# Patient Record
Sex: Male | Born: 1935 | Race: White | Hispanic: No | Marital: Married | Smoking: Former smoker
Health system: Southern US, Community
[De-identification: ages and names within clinical notes are randomized; demographics above are authoritative.]

## PROBLEM LIST (undated history)

## (undated) DIAGNOSIS — R0902 Hypoxemia: Secondary | ICD-10-CM

## (undated) DIAGNOSIS — F319 Bipolar disorder, unspecified: Secondary | ICD-10-CM

## (undated) DIAGNOSIS — E785 Hyperlipidemia, unspecified: Secondary | ICD-10-CM

## (undated) DIAGNOSIS — G4730 Sleep apnea, unspecified: Secondary | ICD-10-CM

## (undated) DIAGNOSIS — F32A Depression, unspecified: Secondary | ICD-10-CM

## (undated) DIAGNOSIS — J189 Pneumonia, unspecified organism: Secondary | ICD-10-CM

## (undated) DIAGNOSIS — I714 Abdominal aortic aneurysm, without rupture: Secondary | ICD-10-CM

## (undated) DIAGNOSIS — N400 Enlarged prostate without lower urinary tract symptoms: Secondary | ICD-10-CM

## (undated) DIAGNOSIS — I10 Essential (primary) hypertension: Secondary | ICD-10-CM

## (undated) DIAGNOSIS — F329 Major depressive disorder, single episode, unspecified: Secondary | ICD-10-CM

## (undated) HISTORY — DX: Benign prostatic hyperplasia without lower urinary tract symptoms: N40.0

## (undated) HISTORY — DX: Pneumonia, unspecified organism: J18.9

## (undated) HISTORY — DX: Hyperlipidemia, unspecified: E78.5

## (undated) HISTORY — DX: Sleep apnea, unspecified: G47.30

## (undated) HISTORY — DX: Abdominal aortic aneurysm, without rupture: I71.4

## (undated) HISTORY — DX: Hypoxemia: R09.02

---

## 1998-07-19 ENCOUNTER — Emergency Department (HOSPITAL_COMMUNITY): Admission: EM | Admit: 1998-07-19 | Discharge: 1998-07-19 | Payer: Self-pay | Admitting: Emergency Medicine

## 2000-11-30 ENCOUNTER — Ambulatory Visit (HOSPITAL_BASED_OUTPATIENT_CLINIC_OR_DEPARTMENT_OTHER): Admission: RE | Admit: 2000-11-30 | Discharge: 2000-11-30 | Payer: Self-pay | Admitting: Otolaryngology

## 2008-04-02 ENCOUNTER — Other Ambulatory Visit: Payer: Self-pay | Admitting: Emergency Medicine

## 2008-04-03 ENCOUNTER — Inpatient Hospital Stay (HOSPITAL_COMMUNITY): Admission: EM | Admit: 2008-04-03 | Discharge: 2008-04-16 | Payer: Self-pay | Admitting: Psychiatry

## 2008-04-03 ENCOUNTER — Ambulatory Visit: Payer: Self-pay | Admitting: Psychiatry

## 2008-04-04 ENCOUNTER — Encounter: Payer: Self-pay | Admitting: Psychiatry

## 2008-04-23 ENCOUNTER — Emergency Department (HOSPITAL_COMMUNITY): Admission: EM | Admit: 2008-04-23 | Discharge: 2008-04-24 | Payer: Self-pay | Admitting: Emergency Medicine

## 2009-12-18 ENCOUNTER — Encounter: Admission: RE | Admit: 2009-12-18 | Discharge: 2009-12-18 | Payer: Self-pay | Admitting: Otolaryngology

## 2010-02-02 ENCOUNTER — Encounter
Admission: RE | Admit: 2010-02-02 | Discharge: 2010-02-02 | Payer: Self-pay | Source: Home / Self Care | Attending: Family Medicine | Admitting: Family Medicine

## 2010-07-19 LAB — COMPREHENSIVE METABOLIC PANEL
ALT: 160 U/L — ABNORMAL HIGH (ref 0–53)
AST: 112 U/L — ABNORMAL HIGH (ref 0–37)
Albumin: 3.6 g/dL (ref 3.5–5.2)
Alkaline Phosphatase: 64 U/L (ref 39–117)
Alkaline Phosphatase: 104 U/L (ref 39–117)
BUN: 23 mg/dL (ref 6–23)
CO2: 26 mEq/L (ref 19–32)
Calcium: 9.4 mg/dL (ref 8.4–10.5)
Calcium: 8.5 mg/dL (ref 8.4–10.5)
Creatinine, Ser: 1.23 mg/dL (ref 0.4–1.5)
GFR calc Af Amer: 52 mL/min — ABNORMAL LOW (ref >60)
GFR calc non Af Amer: 43 mL/min — ABNORMAL LOW (ref >60)
Glucose, Bld: 112 mg/dL — ABNORMAL HIGH (ref 70–99)
Potassium: 3.7 mEq/L (ref 3.5–5.1)
Potassium: 3.9 mEq/L (ref 3.5–5.1)
Sodium: 133 mEq/L — ABNORMAL LOW (ref 135–145)
Total Protein: 6.2 g/dL (ref 6.0–8.3)
Total Protein: 6.4 g/dL (ref 6.0–8.3)

## 2010-07-19 LAB — T4, FREE: Free T4: 1.3 ng/dL (ref 0.89–1.80)

## 2010-07-19 LAB — LITHIUM LEVEL: Lithium Lvl: <0.25 mEq/L — ABNORMAL LOW (ref 0.80–1.40)

## 2010-07-19 LAB — DIFFERENTIAL
Eosinophils Absolute: 0.2 10*3/uL (ref 0.0–0.7)
Eosinophils Relative: 2 % (ref 0–5)
Lymphs Abs: 0.6 10*3/uL — ABNORMAL LOW (ref 0.7–4.0)
Monocytes Relative: 11 % (ref 3–12)

## 2010-07-19 LAB — URINE MICROSCOPIC-ADD ON

## 2010-07-19 LAB — URINALYSIS, ROUTINE W REFLEX MICROSCOPIC
Glucose, UA: NEGATIVE mg/dL
Ketones, ur: NEGATIVE mg/dL
Protein, ur: NEGATIVE mg/dL

## 2010-07-19 LAB — CBC
MCHC: 34.3 g/dL (ref 30.0–36.0)
RBC: 4.42 MIL/uL (ref 4.22–5.81)

## 2010-07-19 LAB — VITAMIN B12: Vitamin B-12: 461 pg/mL (ref 211–911)

## 2010-07-19 LAB — TSH: TSH: 1.622 u[IU]/mL (ref 0.350–4.500)

## 2010-07-19 LAB — ACETAMINOPHEN LEVEL: Acetaminophen (Tylenol), Serum: <10 ug/mL — ABNORMAL LOW (ref 10–30)

## 2010-07-19 LAB — RPR: RPR Ser Ql: NONREACTIVE

## 2010-08-17 NOTE — Discharge Summary (Signed)
NAME:  Jeffrey Nguyen, Jeffrey Nguyen                ACCOUNT NO.:  [id number]   MEDICAL RECORD NO.:  [id number]          PATIENT TYPE:  IPS   LOCATION:  0302                          FACILITY:  [facility]   PHYSICIAN:  Anselm Jungling, MD  DATE OF BIRTH:  10-05-1935   DATE OF ADMISSION:  04/03/2008  DATE OF DISCHARGE:  04/16/2008                               DISCHARGE SUMMARY   IDENTIFYING DATA AND REASON FOR ADMISSION:  The patient is a 75 year old  married male with a history of chronic schizophrenia and alcohol abuse.  He was admitted due to decompensation of his primary psychiatric  disorder as well as alcohol abuse.  Please refer to the admission note  for further details pertaining to the symptoms, circumstances and  history that led to his hospitalization.  He was given initial Axis I  diagnosis of psychosis NOS, and substance abuse NOS.   LABORATORY:  The patient was medically and physically assessed in the  emergency department, and reviewed again by the psychiatric nurse  practitioner upon admission to the adult inpatient service.  He came to  [place] with history of hypertension and was continued on lisinopril 10 mg  daily.  At the time of discharge, he was directed to return to his  personal medical physician for general followup and medical care.   HOSPITAL COURSE:  The patient was admitted to the adult inpatient  psychiatric service.  He presented as a disheveled, ill appearing many  who was adequately nourished.  He appeared disoriented, confused, and  difficulty explaining why he was hospitalized.  He stated that he was  in a bad place.   The patient remained in bed for most of the first 3 days of his hospital  stay.  On the third hospital day, the only verbal response he made to  the undersigned was a loud hooting noise.  He then covered his eyes and  moaned.   He was started on a regimen of low-dose Haldol 5 mg t.i.d., which was  later changed 10 mg nightly.  With this, he cleared  gradually over the  next several days, was able to participate more in therapeutic groups  and activities, and was able to be transferred to the open unit.   On the eighth hospital day there was a family session involving the  patient and his wife.  His difficulties prior to admission were  discussed at length.  In this meeting, the patient revealed some  delusional ideation that he had not verbalized previously.  This was  indication to the treatment team that the patient needed more time for  stabilization.  His hospital stay continued an additional 7 days, and he  continued to show gradual improvement in his thinking, cognition, and  participation.   On the 14th hospital day, the patient appeared appropriate for discharge  home with his wife.   AFTERCARE:  The patient was to follow up with Dr. Betti Cruz at [facility] with an appointment on June 02, 2008 at 11 a.m.  He was put  on an early cancellation list in case he could  be seen by Dr. Betti Cruz  sooner.   DISCHARGE MEDICATIONS:  1. Haldol 10 mg nightly.  2. Lisinopril 10 mg daily.   The patient was instructed to follow up with his usual medical Jeffrey Nguyen  regarding his general medical needs.   DISCHARGE DIAGNOSES:  AXIS I:  Schizophrenia, chronic undifferentiated  type, acute exacerbation, resolving, and alcohol abuse not otherwise  specified.  AXIS II:  Deferred.  AXIS III:  History of hypertension.  AXIS IV:  Stressors severe.  AXIS V:  GAF on discharge 55.      Anselm Jungling, MD  Electronically Signed     SPB/MEDQ  D:  04/17/2008  T:  04/17/2008  Job:  [id number]

## 2011-01-07 LAB — RAPID URINE DRUG SCREEN, HOSP PERFORMED
Barbiturates: NOT DETECTED
Benzodiazepines: NOT DETECTED
Cocaine: NOT DETECTED
Opiates: NOT DETECTED

## 2011-01-07 LAB — CBC
Hemoglobin: 16.3 g/dL (ref 13.0–17.0)
RBC: 5.32 MIL/uL (ref 4.22–5.81)
RDW: 13.4 % (ref 11.5–15.5)
WBC: 8.9 10*3/uL (ref 4.0–10.5)

## 2011-01-07 LAB — URINALYSIS, ROUTINE W REFLEX MICROSCOPIC
Glucose, UA: NEGATIVE mg/dL
Ketones, ur: 40 mg/dL — AB
Leukocytes, UA: NEGATIVE
Protein, ur: 30 mg/dL — AB

## 2011-01-07 LAB — COMPREHENSIVE METABOLIC PANEL
ALT: 87 U/L — ABNORMAL HIGH (ref 0–53)
Alkaline Phosphatase: 74 U/L (ref 39–117)
CO2: 21 mEq/L (ref 19–32)
Glucose, Bld: 122 mg/dL — ABNORMAL HIGH (ref 70–99)
Potassium: 3.8 mEq/L (ref 3.5–5.1)
Sodium: 133 mEq/L — ABNORMAL LOW (ref 135–145)
Total Protein: 7.3 g/dL (ref 6.0–8.3)

## 2011-01-07 LAB — DIFFERENTIAL
Basophils Relative: 0 % (ref 0–1)
Eosinophils Absolute: 0 10*3/uL (ref 0.0–0.7)
Monocytes Relative: 10 % (ref 3–12)
Neutrophils Relative %: 74 % (ref 43–77)

## 2011-01-07 LAB — ETHANOL: Alcohol, Ethyl (B): <5 mg/dL (ref 0–10)

## 2011-01-07 LAB — URINE MICROSCOPIC-ADD ON

## 2011-03-24 ENCOUNTER — Emergency Department (HOSPITAL_COMMUNITY)
Admission: EM | Admit: 2011-03-24 | Discharge: 2011-03-24 | Disposition: A | Discharge status: Other Acute Inpatient Hospital | Payer: Medicare Other | Attending: Emergency Medicine | Admitting: Emergency Medicine

## 2011-03-24 DIAGNOSIS — F319 Bipolar disorder, unspecified: Secondary | ICD-10-CM | POA: Insufficient documentation

## 2011-03-24 DIAGNOSIS — I10 Essential (primary) hypertension: Secondary | ICD-10-CM | POA: Insufficient documentation

## 2011-03-24 DIAGNOSIS — R45851 Suicidal ideations: Secondary | ICD-10-CM | POA: Insufficient documentation

## 2011-03-24 HISTORY — DX: Depression, unspecified: F32.A

## 2011-03-24 HISTORY — DX: Essential (primary) hypertension: I10

## 2011-03-24 HISTORY — DX: Major depressive disorder, single episode, unspecified: F32.9

## 2011-03-24 HISTORY — DX: Bipolar disorder, unspecified: F31.9

## 2011-03-24 LAB — BASIC METABOLIC PANEL
BUN: 17 mg/dL (ref 6–23)
Calcium: 8.9 mg/dL (ref 8.4–10.5)
Chloride: 103 mEq/L (ref 96–112)
Creatinine, Ser: 1.27 mg/dL (ref 0.50–1.35)
GFR calc Af Amer: 63 mL/min — ABNORMAL LOW (ref >90)

## 2011-03-24 LAB — RAPID URINE DRUG SCREEN, HOSP PERFORMED
Barbiturates: NOT DETECTED
Benzodiazepines: NOT DETECTED

## 2011-03-24 LAB — CBC
HCT: 45.7 % (ref 39.0–52.0)
MCH: 29.8 pg (ref 26.0–34.0)
MCHC: 34.4 g/dL (ref 30.0–36.0)
MCV: 86.7 fL (ref 78.0–100.0)
Platelets: 243 10*3/uL (ref 150–400)
RDW: 13.8 % (ref 11.5–15.5)

## 2011-03-24 LAB — ETHANOL: Alcohol, Ethyl (B): <11 mg/dL (ref 0–11)

## 2011-03-24 MED ORDER — ALUM & MAG HYDROXIDE-SIMETH 200-200-20 MG/5ML PO SUSP: 30.0000 mL | ORAL | Status: DC | PRN

## 2011-03-24 MED ORDER — TEMAZEPAM 15 MG PO CAPS: 30.0000 mg | ORAL_CAPSULE | Freq: Every evening | ORAL | Status: DC | PRN

## 2011-03-24 MED ORDER — LORAZEPAM 1 MG PO TABS: 1.0000 mg | ORAL_TABLET | Freq: Three times a day (TID) | ORAL | Status: DC | PRN

## 2011-03-24 MED ORDER — ROSUVASTATIN CALCIUM 20 MG PO TABS
20.0000 mg | ORAL_TABLET | Freq: Every day | ORAL | Status: DC
  Administered 2011-03-24: 20 mg via ORAL
  Filled 2011-03-24: qty 1

## 2011-03-24 MED ORDER — VENLAFAXINE HCL ER 150 MG PO CP24
300.0000 mg | ORAL_CAPSULE | Freq: Every day | ORAL | Status: DC
  Administered 2011-03-24: 300 mg via ORAL
  Filled 2011-03-24: qty 2

## 2011-03-24 MED ORDER — LORAZEPAM 1 MG PO TABS: 1.0000 mg | ORAL_TABLET | Freq: Four times a day (QID) | ORAL | Status: DC | PRN

## 2011-03-24 MED ORDER — ACETAMINOPHEN 325 MG PO TABS: 650.0000 mg | ORAL_TABLET | ORAL | Status: DC | PRN

## 2011-03-24 MED ORDER — QUETIAPINE FUMARATE 200 MG PO TABS: 200.0000 mg | ORAL_TABLET | Freq: Every day | ORAL | Status: DC

## 2011-03-24 MED ORDER — IBUPROFEN 200 MG PO TABS: 600.0000 mg | ORAL_TABLET | Freq: Three times a day (TID) | ORAL | Status: DC | PRN

## 2011-03-24 MED ORDER — ONDANSETRON HCL 8 MG PO TABS: 4.0000 mg | ORAL_TABLET | Freq: Three times a day (TID) | ORAL | Status: DC | PRN

## 2011-03-24 MED ORDER — BENZTROPINE MESYLATE 1 MG/ML IJ SOLN: 2.0000 mg | Freq: Every day | INTRAMUSCULAR | Status: DC

## 2011-03-24 MED ORDER — ZOLPIDEM TARTRATE 5 MG PO TABS: 5.0000 mg | ORAL_TABLET | Freq: Every evening | ORAL | Status: DC | PRN

## 2011-03-24 MED ORDER — LISINOPRIL 10 MG PO TABS
10.0000 mg | ORAL_TABLET | Freq: Every day | ORAL | Status: DC
  Administered 2011-03-24: 10 mg via ORAL
  Filled 2011-03-24: qty 1

## 2011-03-24 NOTE — ED Notes (Signed)
Received fax from telepsych given to Dr Alto Denver.

## 2011-03-24 NOTE — ED Notes (Signed)
Asked pt why he didn't get haldol injection today.  Pt stated the nurse had asked a lot of questions and told him he needed to be evaluated. Held haldol.  I asked pt again if he had a plan for suicide, he stated he was going to use a rope.

## 2011-03-24 NOTE — BH Assessment (Signed)
Assessment Note   Jeffrey Nguyen is an 75 y.o. male.  Patient presented to the ED complaining of SI, depression and extreme anxiety.  Patient was brought into the ED from his psychiatrists office due to his intent to kill himself.  Patient informed the ED nurse that he wanted to kill himself and currently had a plan.  Patient recanted his claim of having a current suicidal plan and the ED corrected the initial 2 assessments in EPIC.  Patient denies A/V hallucinations as well as HI.  Patient would not make eye contact with the assessor when discussing symptoms.  Patient informed the assessor that he is experiencing extreme anxiety and depression due to finances.  Patient informed the assessor that he did not intend to harm himself and did not have a suicidal plan.  When directly asked if he could contract for safety the patient broke eye contact with the assessor and stated that he could.  Patient lives with his male partner of 20+ years and does not have children.  Patient states he does not have children.  Patient did not make eye contact with the assessor again for the remainder of the assessment.  Patient has a history of Bipolar disorder and has attempted suicide in the past by allegedly jumping out of the window of the [facility].  Patient is currently receiving a 28 injection cycle of Haldol to control his psychiatric symptoms.  Patient was in fact at his doctors appointment to receive his injection when he was brought to the ED. Patient is currently awaiting TelePsych consult and medical clearance.  Referrals to follow telepsych findings and MD recommendations.     Axis I: Bipolar, mixed Axis II: Deferred Axis III:  Past Medical History  Diagnosis Date  . Bipolar affective disorder   . Depression   . Hypertension    Axis IV: economic problems, other psychosocial or environmental problems and problems related to social environment Axis V: 21-30 behavior considerably influenced by delusions or  hallucinations OR serious impairment in judgment, communication OR inability to function in almost all areas  Past Medical History:  Past Medical History  Diagnosis Date  . Bipolar affective disorder   . Depression   . Hypertension     History reviewed. No pertinent past surgical history.  Family History: History reviewed. No pertinent family history.  Social History:  reports that he has never smoked. He does not have any smokeless tobacco history on file. He reports that he drinks alcohol. He reports that he does not use illicit drugs.  Additional Social History:  Alcohol / Drug Use History of alcohol / drug use?: No history of alcohol / drug abuse Allergies: No Known Allergies  Home Medications:  Medications Prior to Admission  Medication Dose Route Frequency Provider Last Rate Last Dose  . acetaminophen (TYLENOL) tablet 650 mg  650 mg Oral Q4H PRN Cyndra Numbers, MD      . alum & mag hydroxide-simeth (MAALOX/MYLANTA) 200-200-20 MG/5ML suspension 30 mL  30 mL Oral PRN Cyndra Numbers, MD      . ibuprofen (ADVIL,MOTRIN) tablet 600 mg  600 mg Oral Q8H PRN Meagan Hunt, MD      . lisinopril (PRINIVIL,ZESTRIL) tablet 10 mg  10 mg Oral Daily Meagan Hunt, MD      . LORazepam (ATIVAN) tablet 1 mg  1 mg Oral Q8H PRN Meagan Hunt, MD      . ondansetron (ZOFRAN) tablet 4 mg  4 mg Oral Q8H PRN Cyndra Numbers, MD      .  rosuvastatin (CRESTOR) tablet 20 mg  20 mg Oral Daily Meagan Hunt, MD      . venlafaxine (EFFEXOR-XR) 24 hr capsule 300 mg  300 mg Oral Daily Meagan Hunt, MD      . zolpidem (AMBIEN) tablet 5 mg  5 mg Oral QHS PRN Cyndra Numbers, MD       No current outpatient prescriptions on file as of 03/24/2011.    OB/GYN Status:  No LMP for male patient.  General Assessment Data Location of Assessment: [facility] ED Living Arrangements: Non-Relatives Can pt return to current living arrangement?: Yes Admission Status: Voluntary Is patient capable of signing voluntary admission?: Yes Transfer from:  Other (Comment) (Dr. Reddy's office) Referral Source: Self/Family/Friend  Education Status Is patient currently in school?: No Current Grade: n/a Highest grade of school patient has completed: unkown Name of school: n/a Contact person: Ms. Harriett Sine (partner)  Risk to self Suicidal Ideation: Yes-Currently Present Suicidal Intent: No (Pt admittted to SI 20 minutes ago but denies presently) Is patient at risk for suicide?: Yes (actue depression and Bipolar dx) Suicidal Plan?: No (Pt told nurse he had a plan upon admission) Access to Means: No What has been your use of drugs/alcohol within the last 12 months?: none Previous Attempts/Gestures: Yes (jumped out of the 2nd floor window of [facility]) How many times?: 2  Other Self Harm Risks: none Triggers for Past Attempts: Unpredictable Intentional Self Injurious Behavior: None Family Suicide History: No (denies) Recent stressful life event(s): Financial Problems Persecutory voices/beliefs?: No Depression: Yes Depression Symptoms: Despondent;Isolating;Fatigue;Guilt;Loss of interest in usual pleasures;Feeling worthless/self pity;Feeling angry/irritable;Insomnia Substance abuse history and/or treatment for substance abuse?: No Suicide prevention information given to non-admitted patients: Not applicable  Risk to Others Homicidal Ideation: No Thoughts of Harm to Others: No Current Homicidal Intent: No Current Homicidal Plan: No Access to Homicidal Means: No Identified Victim: none History of harm to others?: No Assessment of Violence: None Noted Violent Behavior Description: none Does patient have access to weapons?: No Criminal Charges Pending?: No Does patient have a court date: No  Psychosis Hallucinations: None noted Delusions: Persecutory  Mental Status Report Appear/Hygiene: Disheveled;Poor hygiene Eye Contact: Poor (EC initially good until asked about contract for safety) Motor Activity: Unremarkable Speech: Other (Comment)  (Normal) Level of Consciousness: Alert Mood: Anxious;Apathetic;Irritable Affect: Other (Comment) (Flat) Anxiety Level: Severe Thought Processes: Coherent;Relevant Judgement: Impaired Orientation: Person;Place;Situation;Time Obsessive Compulsive Thoughts/Behaviors: None  Cognitive Functioning Concentration: Decreased Memory: Recent Intact;Remote Intact IQ: Average Insight: Poor Impulse Control: Poor Appetite: Good Weight Loss: 0  Weight Gain: 0  Sleep: Decreased Total Hours of Sleep: 4  Vegetative Symptoms: None  Prior Inpatient Therapy Prior Inpatient Therapy: Yes Prior Therapy Dates: 2 years ago Prior Therapy Facilty/Provider(s): [facility] Reason for Treatment: depression, anxiety  Prior Outpatient Therapy Prior Outpatient Therapy: Yes Prior Therapy Dates: Present Prior Therapy Facilty/Provider(s): Dr. Betti Cruz Reason for Treatment: depression, anxiety          Abuse/Neglect Assessment (Assessment to be complete while patient is alone) Physical Abuse: Denies Verbal Abuse: Denies Sexual Abuse: Denies Exploitation of patient/patient's resources: Denies Self-Neglect: Denies          Additional Information 1:1 In Past 12 Months?: No CIRT Risk: No Elopement Risk: No Does patient have medical clearance?: No     Disposition:  Disposition Disposition of Patient: Inpatient treatment program Type of inpatient treatment program: Adult  On Site Evaluation by:   Reviewed with Physician:     Danelle Berry 03/24/2011 12:54 PM

## 2011-03-24 NOTE — ED Notes (Signed)
Patient notified about telepsych verbalized understanding. Patient calm cooperative watching TV sitter at bedside.

## 2011-03-24 NOTE — ED Notes (Signed)
Telepsych called completed assessment and will send information via fax.

## 2011-03-24 NOTE — ED Notes (Signed)
2 black wallets and cash given to [employer], patient's significant other. Patient still has clothing.

## 2011-03-24 NOTE — ED Notes (Signed)
Sitter form completed and sitter with patient

## 2011-03-24 NOTE — ED Notes (Signed)
Patient presents with suicidal ideation x 1 week and reports he has a plan.  Patient family reports he was at his Dr.'s office when the nurse noted the patient making suicidal comments.  Patient denies homicidal ideation.

## 2011-03-24 NOTE — ED Notes (Signed)
All belongings given to wife to take home.  Behavioral Pt Checklist completed. Pt was asked if he was suicidal.  Response was, "I've thought about it".  Pt asked if he had a suicide plan.  Pt responded "No plan, just had a lot of anxiety and I thought about it".

## 2011-03-24 NOTE — ED Notes (Signed)
Cyndra Numbers MD signed telepsych paperwork and it was faxed to telepsych

## 2011-03-24 NOTE — ED Notes (Signed)
Patient family reports patient was supposed to get his Haldol injection today but did not receive it.

## 2011-03-24 NOTE — ED Notes (Signed)
Currently on telepsych

## 2011-03-24 NOTE — ED Notes (Signed)
Spoke with Telepsych stated Dr Berlin Hun will call back within one hour.

## 2011-03-24 NOTE — ED Notes (Signed)
Spoke with Dr Berlin Hun will assess patient in 10 minutes.

## 2011-03-24 NOTE — ED Notes (Signed)
[facility] here to take pt to [facility]. Report called to D[facility] at [facility]. Pt placed on EMS stretcher and left ER.

## 2011-03-24 NOTE — ED Notes (Signed)
Pt ambulatory in hallway. No distress noted. Steady gait noted. Pt awaiting transport to [facility] facility.

## 2011-03-24 NOTE — ED Provider Notes (Addendum)
History     CSN: [id number]  Arrival date & time 03/24/11  1110   First MD Initiated Contact with Patient 03/24/11 1126      Chief Complaint  Patient presents with  . Suicidal    (Consider location/radiation/quality/duration/timing/severity/associated sxs/prior treatment) HPI Patient is a 75 year old male who presents today with complaint of suicidal ideation. He is a history of bipolar disorder. Patient has been feeling depressed for about a week. He thinks this was brought on by finances. He does not have a definite plan. [facility] He denies any other medical symptoms today. He does not use any drugs. Patient also does not use alcohol or smoke. He is normally on a 28 day injection of Haldol. Patient was due to receive this today but did not receive it as he was sent from his doctor's office here to be evaluated and likely admitted. Patient has had difficulty sleeping at night. There are no other associated or modifying factors  Past Medical History  Diagnosis Date  . Bipolar affective disorder   . Depression   . Hypertension     History reviewed. No pertinent past surgical history.  History reviewed. No pertinent family history.  History  Substance Use Topics  . Smoking status: Never Smoker   . Smokeless tobacco: Not on file  . Alcohol Use: Yes      Review of Systems  Constitutional: Negative.   HENT: Negative.   Eyes: Negative.   Respiratory: Negative.   Cardiovascular: Negative.   Gastrointestinal: Negative.   Genitourinary: Negative.   Musculoskeletal: Negative.   Skin: Negative.   Neurological: Negative.   Hematological: Negative.   Psychiatric/Behavioral: Positive for suicidal ideas and sleep disturbance. The patient is nervous/anxious.   All other systems reviewed and are negative.    Allergies  Review of patient's allergies indicates no known allergies.  Home Medications   Current  Outpatient Rx  Name Route Sig Dispense Refill  . LISINOPRIL 10 MG PO TABS Oral Take 10 mg by mouth daily.      Marland Kitchen ROSUVASTATIN CALCIUM 20 MG PO TABS Oral Take 20 mg by mouth daily.      . VENLAFAXINE HCL 150 MG PO CP24 Oral Take 300 mg by mouth daily.      Marland Kitchen HALOPERIDOL DECANOATE 100 MG/ML IM SOLN Intramuscular Inject 50 mg into the muscle every 28 (twenty-eight) days.        BP 139/84  Pulse 106  Temp(Src) 97.8 F (36.6 C) (Oral)  Resp 16  SpO2 98%  Physical Exam  Nursing note and vitals reviewed. Constitutional: He is oriented to person, place, and time. He appears well-developed and well-nourished. No distress.  HENT:  Head: Normocephalic and atraumatic.  Eyes: Conjunctivae and EOM are normal. Pupils are equal, round, and reactive to light.  Neck: Normal range of motion.  Cardiovascular: Normal rate, regular rhythm, normal heart sounds and intact distal pulses.  Exam reveals no gallop and no friction rub.   No murmur heard. Pulmonary/Chest: Effort normal and breath sounds normal. No respiratory distress. He has no wheezes. He has no rales.  Abdominal: Soft. Bowel sounds are normal. He exhibits no distension. There is no tenderness. There is no rebound and no guarding.  Musculoskeletal: Normal range of motion.  Neurological: He is alert and oriented to person, place, and time. No cranial nerve deficit. He exhibits normal muscle tone. Coordination normal.  Skin: Skin is warm and dry. No rash noted.  Psychiatric: His speech is normal. His affect is blunt. He is withdrawn. Cognition and memory are normal. He exhibits a depressed mood. He expresses suicidal ideation. He expresses no homicidal ideation. He expresses no suicidal plans.    ED Course  Procedures (including critical care time)  Labs Reviewed  BASIC METABOLIC PANEL - Abnormal; Notable for the following:    Glucose, Bld 126 (*)    GFR calc non Af Amer 54 (*)    GFR calc Af Amer 63 (*)    All other components within  normal limits  CBC  ETHANOL  URINE RAPID DRUG SCREEN (HOSP PERFORMED)   No results found.   1. Suicidal ideation       MDM  Patient was evaluated in stretcher triaged. He was suicidal and depressed affect. Patient was transferred to yellow. Holding orders in addition to orders of patient's normal medications were placed. I did not order his Haldol injection initially. Consult to the act team as well as consult for telepsych was performed. Patient was neither and alcoholic nor diabetic. At this time patient is pending acts team consult as well as telephonic consult. I will hold patient's Haldol injection until telepsych consult is complete.  12:31 PM Patient was discussed with the act team. They'll evaluate the patient. CBC is within normal limits and drug screen is negative. Metabolic panel and alcohol level pending at this time.   1:07 PM Remainder of results were within normal limits. Telepsych order was completed. Patient pending recommendations and placement at this time.  6:11 PM Telepsych reccs delivered and ordered.  Hold haldol injeection for now.  Seroquel increased, cogentin, effexor, and restoril added.  Ambien.  Discontinued.  9:07 PM Patient accepted at [facility]. Pending transfer at this time.        Cyndra Numbers, MD 03/24/11 0454  Cyndra Numbers, MD 03/24/11 0981  Cyndra Numbers, MD 03/24/11 2108

## 2011-03-24 NOTE — ED Notes (Signed)
Security at bedside to wand patient. Tresa Endo, charge RN to call house coverage.

## 2012-01-03 DIAGNOSIS — I714 Abdominal aortic aneurysm, without rupture, unspecified: Secondary | ICD-10-CM

## 2012-01-03 HISTORY — DX: Abdominal aortic aneurysm, without rupture: I71.4

## 2012-01-03 HISTORY — DX: Abdominal aortic aneurysm, without rupture, unspecified: I71.40

## 2012-06-27 ENCOUNTER — Emergency Department (INDEPENDENT_AMBULATORY_CARE_PROVIDER_SITE_OTHER)
Admission: EM | Admit: 2012-06-27 | Discharge: 2012-06-27 | Disposition: A | Discharge status: Home / Self Care | Payer: Medicare Other | Source: Home / Self Care | Attending: Family Medicine | Admitting: Family Medicine

## 2012-06-27 ENCOUNTER — Encounter (HOSPITAL_COMMUNITY): Payer: Self-pay | Admitting: Emergency Medicine

## 2012-06-27 DIAGNOSIS — E860 Dehydration: Secondary | ICD-10-CM

## 2012-06-27 DIAGNOSIS — R42 Dizziness and giddiness: Secondary | ICD-10-CM

## 2012-06-27 DIAGNOSIS — W19XXXA Unspecified fall, initial encounter: Secondary | ICD-10-CM

## 2012-06-27 LAB — POCT URINALYSIS DIP (DEVICE)
Glucose, UA: NEGATIVE mg/dL
Leukocytes, UA: NEGATIVE
Nitrite: NEGATIVE
Urobilinogen, UA: 0.2 mg/dL (ref 0.0–1.0)

## 2012-06-27 LAB — POCT I-STAT, CHEM 8
HCT: 49 % (ref 39.0–52.0)
Hemoglobin: 16.7 g/dL (ref 13.0–17.0)
Potassium: 4.2 mEq/L (ref 3.5–5.1)
Sodium: 140 mEq/L (ref 135–145)

## 2012-06-27 NOTE — ED Notes (Signed)
Pt c/o wrist injury due to a fall Sunday night. Loss balance due to knee weakness and landed on right arm on carpet. No LOC. Elbow was bleeding has since scabbed. Noticed this morning wrist has a knot. Able to bend and rotate wrist without any pain. Pt is alert and oriented with no signs of distress.

## 2012-06-27 NOTE — ED Provider Notes (Signed)
History     CSN: [id number]  Arrival date & time 06/27/12  1355   First MD Initiated Contact with Patient 06/27/12 1735      Chief Complaint  Patient presents with  . Wrist Injury    (Consider location/radiation/quality/duration/timing/severity/associated sxs/prior treatment) HPI Comments: Pt reports falling a lot in the last 2 weeks. Larey Seat once when he stood up, fell out of bed once.  Other falls have been when he was walking, he felt like his L knee gave out on him.  Does have L knee problems for which he has been seeing ortho.  Someone recommended he get his R wrist evaluated today since it was swollen.  He has no pain, weakness or limitation of R hand/wrist, forearm. Denies diarrhea, vomiting, recent illness, fever, chills.  Does feel dizzy sometimes, worse lately.  Does sometimes feel his heart is beating funny, but not at this time.  Per wife (?), no change in mental state, behavior, affect, gait, physical self.   Patient is a 77 y.o. male presenting with wrist injury. The history is provided by the patient and the spouse.  Wrist Injury Location:  Wrist Time since incident:  4 days Injury: no   Wrist location:  R wrist Pain details:    Quality: denies pain. Chronicity:  New Dislocation: no   Relieved by:  None tried Ineffective treatments:  None tried Associated symptoms: swelling   Associated symptoms: no fever, no numbness and no tingling     Past Medical History  Diagnosis Date  . Bipolar affective disorder   . Depression   . Hypertension     History reviewed. No pertinent past surgical history.  History reviewed. No pertinent family history.  History  Substance Use Topics  . Smoking status: Never Smoker   . Smokeless tobacco: Not on file  . Alcohol Use: Yes      Review of Systems  Constitutional: Negative for fever and chills.  Cardiovascular: Positive for palpitations. Negative for chest pain.  Gastrointestinal: Negative for nausea, vomiting and  diarrhea.  Musculoskeletal:       Wrist swelling  Skin: Negative for color change and wound.  Neurological: Positive for dizziness. Negative for syncope, weakness, numbness and headaches.  Psychiatric/Behavioral: Negative for behavioral problems and confusion.    Allergies  Review of patient's allergies indicates no known allergies.  Home Medications   Current Outpatient Rx  Name  Route  Sig  Dispense  Refill  . haloperidol decanoate (HALDOL DECANOATE) 100 MG/ML injection   Intramuscular   Inject 50 mg into the muscle every 28 (twenty-eight) days.           Marland Kitchen lisinopril (PRINIVIL,ZESTRIL) 10 MG tablet   Oral   Take 10 mg by mouth daily.           . rosuvastatin (CRESTOR) 20 MG tablet   Oral   Take 20 mg by mouth daily.           Marland Kitchen venlafaxine (EFFEXOR-XR) 150 MG 24 hr capsule   Oral   Take 300 mg by mouth daily.             BP 90/66  Pulse 92  Temp(Src) 98 F (36.7 C) (Oral)  Resp 20  SpO2 97%  Physical Exam  Constitutional: He is oriented to person, place, and time. He appears well-developed and well-nourished. No distress.  Cardiovascular: Normal rate and regular rhythm.   Pulmonary/Chest: Effort normal and breath sounds normal.  Musculoskeletal:  Right wrist: He exhibits swelling. He exhibits normal range of motion, no tenderness and no bony tenderness.  Slight swelling dorsal/radial area of R wrist. Nontender, function intact.   Neurological: He is alert and oriented to person, place, and time. Coordination and gait normal.  Face and strength symmetrical. Fine motor coordination normal.   Skin: Skin is warm, dry and intact.  Psychiatric:  Flat affect which is normal per wife.     ED Course  Procedures (including critical care time)  Labs Reviewed  POCT URINALYSIS DIP (DEVICE) - Abnormal; Notable for the following:    Bilirubin Urine SMALL (*)    Ketones, ur TRACE (*)    Protein, ur 30 (*)    All other components within normal limits   POCT I-STAT, CHEM 8 - Abnormal; Notable for the following:    BUN 28 (*)    Glucose, Bld 110 (*)    All other components within normal limits   No results found.   1. Dizziness   2. Falls, initial encounter   3. Dehydration, mild       MDM  Pt labs ok except slight ketones in urine. Pt perhaps slightly dehydrated.  EKG without significant change from previous in 2010, NSR.  Discussed with Dr. Alfonse Ras.  Pt ok to d/c, advised to increase fluids tonight for rehydration.  To f/u with pcp tomorrow.  To discuss meds with psychiatrist.  Has been on same dose of psych meds for years and may need adjusted down. Stable for d/c but needs close f/u. Pt agrees to see pcp tomorrow.         Cathlyn Parsons, NP 06/27/12 1759

## 2012-06-28 NOTE — ED Provider Notes (Signed)
Medical screening examination/treatment/procedure(s) were performed by non-physician practitioner and as supervising physician I was immediately available for consultation/collaboration.   MORENO-COLL,Tien Spooner; MD  Markesha Hannig Moreno-Coll, MD 06/28/12 1021 

## 2014-02-27 ENCOUNTER — Emergency Department (HOSPITAL_COMMUNITY): Payer: Medicare Other

## 2014-02-27 ENCOUNTER — Emergency Department (HOSPITAL_COMMUNITY)
Admission: EM | Admit: 2014-02-27 | Discharge: 2014-02-27 | Disposition: A | Discharge status: Home / Self Care | Payer: Medicare Other | Attending: Emergency Medicine | Admitting: Emergency Medicine

## 2014-02-27 ENCOUNTER — Encounter (HOSPITAL_COMMUNITY): Payer: Self-pay | Admitting: *Deleted

## 2014-02-27 DIAGNOSIS — Z8669 Personal history of other diseases of the nervous system and sense organs: Secondary | ICD-10-CM | POA: Insufficient documentation

## 2014-02-27 DIAGNOSIS — Z87448 Personal history of other diseases of urinary system: Secondary | ICD-10-CM | POA: Insufficient documentation

## 2014-02-27 DIAGNOSIS — Y9289 Other specified places as the place of occurrence of the external cause: Secondary | ICD-10-CM | POA: Insufficient documentation

## 2014-02-27 DIAGNOSIS — Z23 Encounter for immunization: Secondary | ICD-10-CM | POA: Diagnosis not present

## 2014-02-27 DIAGNOSIS — Y9389 Activity, other specified: Secondary | ICD-10-CM | POA: Insufficient documentation

## 2014-02-27 DIAGNOSIS — Z8701 Personal history of pneumonia (recurrent): Secondary | ICD-10-CM | POA: Diagnosis not present

## 2014-02-27 DIAGNOSIS — Y998 Other external cause status: Secondary | ICD-10-CM | POA: Diagnosis not present

## 2014-02-27 DIAGNOSIS — Z79899 Other long term (current) drug therapy: Secondary | ICD-10-CM | POA: Diagnosis not present

## 2014-02-27 DIAGNOSIS — R42 Dizziness and giddiness: Secondary | ICD-10-CM | POA: Diagnosis not present

## 2014-02-27 DIAGNOSIS — S80211A Abrasion, right knee, initial encounter: Secondary | ICD-10-CM | POA: Diagnosis not present

## 2014-02-27 DIAGNOSIS — W19XXXA Unspecified fall, initial encounter: Secondary | ICD-10-CM

## 2014-02-27 DIAGNOSIS — S50311A Abrasion of right elbow, initial encounter: Secondary | ICD-10-CM | POA: Insufficient documentation

## 2014-02-27 DIAGNOSIS — T148XXA Other injury of unspecified body region, initial encounter: Secondary | ICD-10-CM

## 2014-02-27 DIAGNOSIS — Z7982 Long term (current) use of aspirin: Secondary | ICD-10-CM | POA: Insufficient documentation

## 2014-02-27 DIAGNOSIS — Z87891 Personal history of nicotine dependence: Secondary | ICD-10-CM | POA: Insufficient documentation

## 2014-02-27 DIAGNOSIS — S8991XA Unspecified injury of right lower leg, initial encounter: Secondary | ICD-10-CM | POA: Diagnosis present

## 2014-02-27 DIAGNOSIS — W1830XA Fall on same level, unspecified, initial encounter: Secondary | ICD-10-CM | POA: Diagnosis not present

## 2014-02-27 DIAGNOSIS — E785 Hyperlipidemia, unspecified: Secondary | ICD-10-CM | POA: Diagnosis not present

## 2014-02-27 DIAGNOSIS — F319 Bipolar disorder, unspecified: Secondary | ICD-10-CM | POA: Diagnosis not present

## 2014-02-27 DIAGNOSIS — I10 Essential (primary) hypertension: Secondary | ICD-10-CM | POA: Diagnosis not present

## 2014-02-27 DIAGNOSIS — M25561 Pain in right knee: Secondary | ICD-10-CM

## 2014-02-27 LAB — URINALYSIS, ROUTINE W REFLEX MICROSCOPIC
Bilirubin Urine: NEGATIVE
Glucose, UA: NEGATIVE mg/dL
HGB URINE DIPSTICK: NEGATIVE
Ketones, ur: NEGATIVE mg/dL
LEUKOCYTES UA: NEGATIVE
NITRITE: NEGATIVE
PROTEIN: NEGATIVE mg/dL
SPECIFIC GRAVITY, URINE: 1.005 (ref 1.005–1.030)
UROBILINOGEN UA: 0.2 mg/dL (ref 0.0–1.0)
pH: 6 (ref 5.0–8.0)

## 2014-02-27 LAB — CBC WITH DIFFERENTIAL/PLATELET
BASOS ABS: 0 10*3/uL (ref 0.0–0.1)
Basophils Relative: 0 % (ref 0–1)
EOS ABS: 0.5 10*3/uL (ref 0.0–0.7)
EOS PCT: 6 % — AB (ref 0–5)
HEMATOCRIT: 39.9 % (ref 39.0–52.0)
Hemoglobin: 13.4 g/dL (ref 13.0–17.0)
LYMPHS ABS: 1.2 10*3/uL (ref 0.7–4.0)
Lymphocytes Relative: 15 % (ref 12–46)
MCH: 28.9 pg (ref 26.0–34.0)
MCHC: 33.6 g/dL (ref 30.0–36.0)
MCV: 86.2 fL (ref 78.0–100.0)
MONO ABS: 0.8 10*3/uL (ref 0.1–1.0)
Monocytes Relative: 10 % (ref 3–12)
Neutro Abs: 5.6 10*3/uL (ref 1.7–7.7)
Neutrophils Relative %: 69 % (ref 43–77)
PLATELETS: 199 10*3/uL (ref 150–400)
RBC: 4.63 MIL/uL (ref 4.22–5.81)
RDW: 14.4 % (ref 11.5–15.5)
WBC: 8 10*3/uL (ref 4.0–10.5)

## 2014-02-27 LAB — COMPREHENSIVE METABOLIC PANEL
ALT: 12 U/L (ref 0–53)
AST: 15 U/L (ref 0–37)
Albumin: 3.2 g/dL — ABNORMAL LOW (ref 3.5–5.2)
Alkaline Phosphatase: 73 U/L (ref 39–117)
Anion gap: 13 (ref 5–15)
BUN: 18 mg/dL (ref 6–23)
CALCIUM: 8.4 mg/dL (ref 8.4–10.5)
CO2: 25 meq/L (ref 19–32)
CREATININE: 1.32 mg/dL (ref 0.50–1.35)
Chloride: 103 mEq/L (ref 96–112)
GFR, EST AFRICAN AMERICAN: 59 mL/min — AB (ref >90)
GFR, EST NON AFRICAN AMERICAN: 51 mL/min — AB (ref >90)
GLUCOSE: 93 mg/dL (ref 70–99)
Potassium: 3.6 mEq/L — ABNORMAL LOW (ref 3.7–5.3)
SODIUM: 141 meq/L (ref 137–147)
TOTAL PROTEIN: 5.8 g/dL — AB (ref 6.0–8.3)
Total Bilirubin: 0.4 mg/dL (ref 0.3–1.2)

## 2014-02-27 MED ORDER — TETANUS-DIPHTH-ACELL PERTUSSIS 5-2.5-18.5 LF-MCG/0.5 IM SUSP
0.5000 mL | Freq: Once | INTRAMUSCULAR | Status: AC
  Administered 2014-02-27: 0.5 mL via INTRAMUSCULAR
  Filled 2014-02-27: qty 0.5

## 2014-02-27 NOTE — ED Provider Notes (Signed)
Patient visit shared. Patient here for frequent falls and evaluation for injuries. No recent illnesses or medication changes. Plan to d/c home with PCP followup.   Tilden FossaElizabeth Sabriya Yono, MD 02/28/14 0110

## 2014-02-27 NOTE — Discharge Instructions (Signed)

## 2014-02-27 NOTE — ED Notes (Signed)
The pt has had multiple falls in the past 3-4 days.  His last fall was todayt.  He has a bloody rt elbow and his rt knee.   His family member  Is concerned that he has been falling around

## 2014-02-27 NOTE — ED Provider Notes (Signed)
CSN: [id number]     Arrival date & time 02/27/14  1525 History   First MD Initiated Contact with Patient 02/27/14 1544     Chief Complaint  Patient presents with  . Fall     (Consider location/radiation/quality/duration/timing/severity/associated sxs/prior Treatment) HPI Comments: Patient presents today after a fall that occurred just prior to arrival.  Patient's wife reports that he has had frequently falls over the past month.  Patient reports that his fall today was caused by a loss of balance.  He is unable to state if he felt lightheaded or vertigo.  He reports landing on his right elbow and right knee when he fell.  He does not think that he hit his head.  No LOC.  He is currently on 325 mg ASA, but no other anticoagulants.  He denies any pain at this time.  No fever, chills, chest pain, abdominal pain, neck pain, back pain, headache, nausea, vomiting, vision changes.  He does have an abrasion to his right knee and right elbow.  He is not sure of the date of his last tetanus. He reports that he has not been evaluated by a Physician for these frequent falls.  He denies any recent changes to medication.  Denies any recent illnesses.    The history is provided by the patient.    Past Medical History  Diagnosis Date  . Bipolar affective disorder   . Depression   . Hypertension   . Hyperlipidemia   . Sleep apnea   . BPH (benign prostatic hyperplasia)   . Hypoxemia   . Pneumonia   . AAA (abdominal aortic aneurysm) 10/13   History reviewed. No pertinent past surgical history. Family History  Problem Relation Age of Onset  . Multiple sclerosis Sister    History  Substance Use Topics  . Smoking status: Former Games developermoker  . Smokeless tobacco: Not on file  . Alcohol Use: Yes    Review of Systems  All other systems reviewed and are negative.     Allergies  Review of patient's allergies indicates no known allergies.  Home Medications   Prior to Admission medications    Medication Sig Start Date End Date Taking? Authorizing Provider  aspirin 325 MG tablet Take 325 mg by mouth daily.   Yes Historical Provider, MD  atorvastatin (LIPITOR) 40 MG tablet Take 40 mg by mouth daily.   Yes Historical Provider, MD  buPROPion (WELLBUTRIN SR) 150 MG 12 hr tablet Take 150 mg by mouth 2 (two) times daily.   Yes Historical Provider, MD  LORazepam (ATIVAN) 1 MG tablet Take 1 mg by mouth every 8 (eight) hours.   Yes Historical Provider, MD  haloperidol decanoate (HALDOL DECANOATE) 100 MG/ML injection Inject 50 mg into the muscle every 28 (twenty-eight) days.      Historical Provider, MD  rosuvastatin (CRESTOR) 20 MG tablet Take 20 mg by mouth daily.      Historical Provider, MD  thiothixene (NAVANE) 5 MG capsule Take 5 mg by mouth 3 (three) times daily.    Historical Provider, MD   BP 108/59 mmHg  Pulse 87  Temp(Src) 97.3 F (36.3 C) (Oral)  Resp 22  SpO2 98% Physical Exam  Constitutional: He appears well-developed and well-nourished.  HENT:  Head: Normocephalic and atraumatic.  Mouth/Throat: Oropharynx is clear and moist.  Eyes: EOM are normal. Pupils are equal, round, and reactive to light.  Neck: Normal range of motion. Neck supple.  Cardiovascular: Normal rate, regular rhythm and normal heart sounds.  Pulses:      Radial pulses are 2+ on the right side, and 2+ on the left side.       Dorsalis pedis pulses are 2+ on the right side, and 2+ on the left side.  Pulmonary/Chest: Effort normal and breath sounds normal. No respiratory distress. He has no wheezes. He has no rales. He exhibits no tenderness.  Abdominal: Soft. Bowel sounds are normal. He exhibits no distension and no mass. There is no tenderness. There is no rebound and no guarding.  Musculoskeletal: Normal range of motion.       Right elbow: He exhibits swelling. Tenderness found.       Right knee: He exhibits swelling and bony tenderness. Tenderness found.       Cervical back: He exhibits normal range  of motion, no tenderness, no bony tenderness, no swelling, no edema and no deformity.       Thoracic back: He exhibits normal range of motion, no tenderness, no bony tenderness, no swelling, no edema and no deformity.       Lumbar back: He exhibits normal range of motion, no tenderness, no bony tenderness, no swelling, no edema and no deformity.  Abrasion of the right knee and right elbow  Neurological: He is alert. He has normal strength. No cranial nerve deficit or sensory deficit.  Distal sensation of both hand intact Distal sensation of both feet intact  Skin: Skin is warm and dry.  Psychiatric: He has a normal mood and affect.  Nursing note and vitals reviewed.   ED Course  Procedures (including critical care time) Labs Review Labs Reviewed  CBC WITH DIFFERENTIAL  COMPREHENSIVE METABOLIC PANEL  URINALYSIS, ROUTINE W REFLEX MICROSCOPIC    Imaging Review Dg Elbow Complete Right  02/27/2014   CLINICAL DATA:  78 year old male with history of fall 4 times today with injury to the right elbow, including a laceration over the elbow.  EXAM: RIGHT ELBOW - COMPLETE 3+ VIEW  COMPARISON:  No priors.  FINDINGS: There is no evidence of fracture, dislocation, or joint effusion. There is no evidence of arthropathy or other focal bone abnormality. Soft tissues are unremarkable.  IMPRESSION: Negative.   Electronically Signed   By: Trudie Reed M.D.   On: 02/27/2014 16:49   Ct Head Wo Contrast  02/27/2014   CLINICAL DATA:  Multiple falls in the past several days  EXAM: CT HEAD WITHOUT CONTRAST  TECHNIQUE: Contiguous axial images were obtained from the base of the skull through the vertex without intravenous contrast.  COMPARISON:  April 04, 2008  FINDINGS: There is mild diffuse atrophy. There is no mass, hemorrhage, extra-axial fluid collection, or midline shift. There is slight small vessel disease in the centra semiovale bilaterally. There is no new gray-white compartment lesion. No acute  infarct apparent. The bony calvarium appears intact. The mastoid air cells are clear. There is a minimal retention cyst in the inferior left maxillary antrum. There is mild leftward deviation of the nasal septum.  IMPRESSION: Mild atrophy with rather minimal periventricular small vessel disease. Slight right maxillary sinus disease. No intracranial mass, hemorrhage, or extra-axial fluid. No acute appearing infarct.   Electronically Signed   By: Bretta Bang M.D.   On: 02/27/2014 17:00   Dg Knee Complete 4 Views Right  02/27/2014   CLINICAL DATA:  Patient has fallen several times today  EXAM: RIGHT KNEE - COMPLETE 4+ VIEW  COMPARISON:  None.  FINDINGS: Frontal, lateral, and bilateral oblique views were obtained. There is no  fracture, dislocation, or effusion. There is moderate narrowing medially. There is mild spurring in all compartments. There is a slightly more prominent spur along the anterior superior patella. No erosive change. There are scattered foci of arterial vascular calcification.  IMPRESSION: Areas of osteoarthritic change, primarily medially. No fracture or effusion.   Electronically Signed   By: Bretta BangWilliam  Woodruff M.D.   On: 02/27/2014 16:49     EKG Interpretation   Date/Time:  Thursday February 27 2014 15:55:49 EST Ventricular Rate:  80 PR Interval:  171 QRS Duration: 93 QT Interval:  472 QTC Calculation: 545 R Axis:   -117 Text Interpretation:  Sinus rhythm Inferior infarct, old Anterior infarct,  old Prolonged QT interval QT interval prolonged compared to prior on  06/27/12, otherwise no change Confirmed by Lincoln Brighamees, Liz [phone]([id number]) on 02/27/2014  4:14:42 PM     5:50 PM Patient ambulated with Gilmer Morane.  No ataxia.  No significant pain with ambulation. MDM   Final diagnoses:  Fall   Patient presents today after a fall.  He reports that he landed on his right knee and right elbow when he fell.  He initially reports that he lost his balance, which caused him to fall.  Later he  stated that his knee gave out.  Xrays of right knee and right hip are negative for acute findings.  Patient neurovascularly intact.  He is able to ambulate with his Gilmer MorCane in the ED. Abrasions cleaned and tetanus updated.  EKG unremarkable.  Labs and UA unremarkable.  No acute findings on Head CT.  Wife reports that she would like the patient to be placed in a facility for rehabilitation.  She was instructed to follow up with the patient's PCP to facilitate that.  Patient is stable for discharge.  Return precautions given.  Patient also evaluated by Dr. Madilyn Hookees who evaluated the patient and is in agreement with the plan.    Santiago GladHeather Ziah Leandro, PA-C 02/27/14 1930  Tilden FossaElizabeth Rees, MD 02/27/14 Corky Crafts1955

## 2014-04-02 ENCOUNTER — Other Ambulatory Visit: Payer: Self-pay | Admitting: Gastroenterology

## 2014-04-02 DIAGNOSIS — R197 Diarrhea, unspecified: Secondary | ICD-10-CM

## 2014-04-02 DIAGNOSIS — R05 Cough: Secondary | ICD-10-CM

## 2014-04-02 DIAGNOSIS — R634 Abnormal weight loss: Secondary | ICD-10-CM

## 2014-04-02 DIAGNOSIS — R059 Cough, unspecified: Secondary | ICD-10-CM

## 2014-04-07 ENCOUNTER — Ambulatory Visit
Admission: RE | Admit: 2014-04-07 | Discharge: 2014-04-07 | Disposition: A | Discharge status: Home / Self Care | Payer: Medicare Other | Source: Ambulatory Visit | Attending: Gastroenterology | Admitting: Gastroenterology

## 2014-04-07 DIAGNOSIS — R05 Cough: Secondary | ICD-10-CM

## 2014-04-07 DIAGNOSIS — R059 Cough, unspecified: Secondary | ICD-10-CM

## 2014-04-07 DIAGNOSIS — R634 Abnormal weight loss: Secondary | ICD-10-CM

## 2014-04-07 DIAGNOSIS — R197 Diarrhea, unspecified: Secondary | ICD-10-CM

## 2014-04-08 ENCOUNTER — Inpatient Hospital Stay (HOSPITAL_COMMUNITY)
Admission: EM | Admit: 2014-04-08 | Discharge: 2014-04-15 | DRG: 193 | Disposition: A | Discharge status: Other Acute Inpatient Hospital | Payer: Medicare Other | Attending: Internal Medicine | Admitting: Internal Medicine

## 2014-04-08 ENCOUNTER — Encounter (HOSPITAL_COMMUNITY): Payer: Self-pay | Admitting: Emergency Medicine

## 2014-04-08 ENCOUNTER — Emergency Department (HOSPITAL_COMMUNITY): Payer: Medicare Other

## 2014-04-08 DIAGNOSIS — R410 Disorientation, unspecified: Secondary | ICD-10-CM | POA: Diagnosis present

## 2014-04-08 DIAGNOSIS — I714 Abdominal aortic aneurysm, without rupture: Secondary | ICD-10-CM | POA: Diagnosis present

## 2014-04-08 DIAGNOSIS — J9601 Acute respiratory failure with hypoxia: Secondary | ICD-10-CM | POA: Diagnosis present

## 2014-04-08 DIAGNOSIS — R4182 Altered mental status, unspecified: Secondary | ICD-10-CM | POA: Insufficient documentation

## 2014-04-08 DIAGNOSIS — F319 Bipolar disorder, unspecified: Secondary | ICD-10-CM | POA: Diagnosis present

## 2014-04-08 DIAGNOSIS — N400 Enlarged prostate without lower urinary tract symptoms: Secondary | ICD-10-CM | POA: Diagnosis present

## 2014-04-08 DIAGNOSIS — I10 Essential (primary) hypertension: Secondary | ICD-10-CM | POA: Diagnosis present

## 2014-04-08 DIAGNOSIS — Z87891 Personal history of nicotine dependence: Secondary | ICD-10-CM

## 2014-04-08 DIAGNOSIS — F05 Delirium due to known physiological condition: Secondary | ICD-10-CM | POA: Diagnosis present

## 2014-04-08 DIAGNOSIS — F329 Major depressive disorder, single episode, unspecified: Secondary | ICD-10-CM | POA: Diagnosis present

## 2014-04-08 DIAGNOSIS — J189 Pneumonia, unspecified organism: Secondary | ICD-10-CM | POA: Diagnosis not present

## 2014-04-08 DIAGNOSIS — R0902 Hypoxemia: Secondary | ICD-10-CM | POA: Diagnosis present

## 2014-04-08 DIAGNOSIS — F0390 Unspecified dementia without behavioral disturbance: Secondary | ICD-10-CM | POA: Diagnosis present

## 2014-04-08 DIAGNOSIS — Z79899 Other long term (current) drug therapy: Secondary | ICD-10-CM

## 2014-04-08 DIAGNOSIS — G9340 Encephalopathy, unspecified: Secondary | ICD-10-CM | POA: Diagnosis present

## 2014-04-08 DIAGNOSIS — G4730 Sleep apnea, unspecified: Secondary | ICD-10-CM | POA: Diagnosis present

## 2014-04-08 DIAGNOSIS — G4733 Obstructive sleep apnea (adult) (pediatric): Secondary | ICD-10-CM | POA: Diagnosis present

## 2014-04-08 DIAGNOSIS — F39 Unspecified mood [affective] disorder: Secondary | ICD-10-CM | POA: Insufficient documentation

## 2014-04-08 DIAGNOSIS — E785 Hyperlipidemia, unspecified: Secondary | ICD-10-CM | POA: Diagnosis present

## 2014-04-08 DIAGNOSIS — Z7982 Long term (current) use of aspirin: Secondary | ICD-10-CM

## 2014-04-08 DIAGNOSIS — R45851 Suicidal ideations: Secondary | ICD-10-CM | POA: Diagnosis present

## 2014-04-08 LAB — RAPID URINE DRUG SCREEN, HOSP PERFORMED
Amphetamines: NOT DETECTED
BARBITURATES: NOT DETECTED
Benzodiazepines: POSITIVE — AB
COCAINE: NOT DETECTED
Opiates: NOT DETECTED
Tetrahydrocannabinol: NOT DETECTED

## 2014-04-08 LAB — COMPREHENSIVE METABOLIC PANEL
ALBUMIN: 4.1 g/dL (ref 3.5–5.2)
ALT: 20 U/L (ref 0–53)
AST: 24 U/L (ref 0–37)
Alkaline Phosphatase: 84 U/L (ref 39–117)
Anion gap: 12 (ref 5–15)
BUN: 28 mg/dL — AB (ref 6–23)
CHLORIDE: 104 meq/L (ref 96–112)
CO2: 21 mmol/L (ref 19–32)
CREATININE: 1.88 mg/dL — AB (ref 0.50–1.35)
Calcium: 9 mg/dL (ref 8.4–10.5)
GFR calc Af Amer: 38 mL/min — ABNORMAL LOW (ref >90)
GFR calc non Af Amer: 33 mL/min — ABNORMAL LOW (ref >90)
Glucose, Bld: 123 mg/dL — ABNORMAL HIGH (ref 70–99)
Potassium: 4.2 mmol/L (ref 3.5–5.1)
Sodium: 137 mmol/L (ref 135–145)
TOTAL PROTEIN: 6.8 g/dL (ref 6.0–8.3)
Total Bilirubin: 0.7 mg/dL (ref 0.3–1.2)

## 2014-04-08 LAB — URINE MICROSCOPIC-ADD ON

## 2014-04-08 LAB — URINALYSIS, ROUTINE W REFLEX MICROSCOPIC
GLUCOSE, UA: NEGATIVE mg/dL
KETONES UR: NEGATIVE mg/dL
LEUKOCYTES UA: NEGATIVE
Nitrite: NEGATIVE
Protein, ur: NEGATIVE mg/dL
Specific Gravity, Urine: 1.024 (ref 1.005–1.030)
UROBILINOGEN UA: 1 mg/dL (ref 0.0–1.0)
pH: 5.5 (ref 5.0–8.0)

## 2014-04-08 LAB — CBC
HEMATOCRIT: 45.5 % (ref 39.0–52.0)
Hemoglobin: 15.2 g/dL (ref 13.0–17.0)
MCH: 29.1 pg (ref 26.0–34.0)
MCHC: 33.4 g/dL (ref 30.0–36.0)
MCV: 87.2 fL (ref 78.0–100.0)
Platelets: 243 10*3/uL (ref 150–400)
RBC: 5.22 MIL/uL (ref 4.22–5.81)
RDW: 14.5 % (ref 11.5–15.5)
WBC: 10.6 10*3/uL — ABNORMAL HIGH (ref 4.0–10.5)

## 2014-04-08 LAB — SALICYLATE LEVEL

## 2014-04-08 LAB — ACETAMINOPHEN LEVEL

## 2014-04-08 LAB — ETHANOL: Alcohol, Ethyl (B): <5 mg/dL (ref 0–9)

## 2014-04-08 MED ORDER — BUPROPION HCL ER (XL) 150 MG PO TB24
150.0000 mg | ORAL_TABLET | Freq: Every day | ORAL | Status: DC
  Administered 2014-04-08 – 2014-04-09 (×2): 150 mg via ORAL
  Filled 2014-04-08 (×2): qty 1

## 2014-04-08 MED ORDER — SODIUM CHLORIDE 0.9 % IV BOLUS (SEPSIS)
1000.0000 mL | Freq: Once | INTRAVENOUS | Status: AC
  Administered 2014-04-08: 1000 mL via INTRAVENOUS

## 2014-04-08 MED ORDER — THIOTHIXENE 10 MG PO CAPS
10.0000 mg | ORAL_CAPSULE | Freq: Two times a day (BID) | ORAL | Status: DC
  Administered 2014-04-08 – 2014-04-09 (×2): 10 mg via ORAL
  Filled 2014-04-08 (×3): qty 1

## 2014-04-08 MED ORDER — ASPIRIN 325 MG PO TABS
325.0000 mg | ORAL_TABLET | Freq: Every day | ORAL | Status: DC
  Administered 2014-04-08 – 2014-04-09 (×2): 325 mg via ORAL
  Filled 2014-04-08 (×2): qty 1

## 2014-04-08 MED ORDER — VENLAFAXINE HCL ER 150 MG PO CP24
300.0000 mg | ORAL_CAPSULE | Freq: Every day | ORAL | Status: DC
  Administered 2014-04-08 – 2014-04-09 (×2): 300 mg via ORAL
  Filled 2014-04-08 (×3): qty 2

## 2014-04-08 MED ORDER — BENZTROPINE MESYLATE 1 MG PO TABS
1.0000 mg | ORAL_TABLET | Freq: Two times a day (BID) | ORAL | Status: DC
  Administered 2014-04-08 – 2014-04-09 (×2): 1 mg via ORAL
  Filled 2014-04-08 (×2): qty 1

## 2014-04-08 MED ORDER — LORAZEPAM 1 MG PO TABS
1.0000 mg | ORAL_TABLET | Freq: Two times a day (BID) | ORAL | Status: DC | PRN
  Administered 2014-04-08 – 2014-04-09 (×2): 1 mg via ORAL
  Filled 2014-04-08 (×3): qty 1

## 2014-04-08 MED ORDER — ATORVASTATIN CALCIUM 40 MG PO TABS
40.0000 mg | ORAL_TABLET | Freq: Every day | ORAL | Status: DC
  Administered 2014-04-08 – 2014-04-09 (×2): 40 mg via ORAL
  Filled 2014-04-08 (×2): qty 1

## 2014-04-08 NOTE — ED Notes (Signed)
Pt transported to CT ?

## 2014-04-08 NOTE — BH Assessment (Signed)
[facility] Assessment Progress Note  The following facilities have been contacted in an effort to place this pt with results as noted:  *[facility]: at capacity Berton Lan*[facility]: beds available, referral faxed with decision pending Earlene Plater*[facility]:  beds available, referral faxed with decision pending  Doylene Canninghomas Elis Sauber, MA Triage Specialist 04/08/2014 @ 17:08
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
## 2014-04-08 NOTE — ED Provider Notes (Signed)
CSN: [id number]     Arrival date & time 04/08/14  4098 History   First MD Initiated Contact with Patient 04/08/14 0340     Chief Complaint  Patient presents with  . Medical Clearance     (Consider location/radiation/quality/duration/timing/severity/associated sxs/prior Treatment) HPI Comments: Girlfriend dropped patient at [facility] for evaluation without specific reason  Patient unable to tell why he was there Denies recent illness, URI, N/V/D headache , dizziness, weakness.  The history is provided by the patient.    Past Medical History  Diagnosis Date  . Bipolar affective disorder   . Depression   . Hypertension   . Hyperlipidemia   . Sleep apnea   . BPH (benign prostatic hyperplasia)   . Hypoxemia   . Pneumonia   . AAA (abdominal aortic aneurysm) 10/13   History reviewed. No pertinent past surgical history. Family History  Problem Relation Age of Onset  . Multiple sclerosis Sister    History  Substance Use Topics  . Smoking status: Former Games developer  . Smokeless tobacco: Not on file  . Alcohol Use: Yes    Review of Systems  Constitutional: Negative for fever and fatigue.  HENT: Negative for congestion.   Respiratory: Negative for shortness of breath.   Cardiovascular: Negative for chest pain.  Musculoskeletal: Negative for myalgias.  Skin: Negative for rash.  Neurological: Negative for weakness and headaches.  All other systems reviewed and are negative.     Allergies  Review of patient's allergies indicates no known allergies.  Home Medications   Prior to Admission medications   Medication Sig Start Date End Date Taking? Authorizing Provider  aspirin 325 MG tablet Take 325 mg by mouth daily.    Historical Provider, MD  atorvastatin (LIPITOR) 40 MG tablet Take 40 mg by mouth daily.    Historical Provider, MD  buPROPion (WELLBUTRIN SR) 150 MG 12 hr tablet Take 150 mg by mouth 2 (two) times daily.    Historical Provider, MD  haloperidol decanoate (HALDOL  DECANOATE) 100 MG/ML injection Inject 50 mg into the muscle every 28 (twenty-eight) days.      Historical Provider, MD  LORazepam (ATIVAN) 1 MG tablet Take 1 mg by mouth every 8 (eight) hours.    Historical Provider, MD   BP 83/54 mmHg  Pulse 114  Temp(Src) 97.3 F (36.3 C) (Oral)  Resp 16  SpO2 92% Physical Exam  Constitutional: He appears well-developed and well-nourished.  HENT:  Head: Atraumatic.  Right Ear: External ear normal.  Left Ear: External ear normal.  Mouth/Throat: Oropharynx is clear and moist.  Eyes: Pupils are equal, round, and reactive to light.  Neck: Normal range of motion.  Cardiovascular: Regular rhythm.  Tachycardia present.   hypotensive  Pulmonary/Chest: Effort normal. No respiratory distress. He has no wheezes. He exhibits no tenderness.  Abdominal: Soft. He exhibits no distension. There is no tenderness.  Musculoskeletal: Normal range of motion. He exhibits no edema or tenderness.  Lymphadenopathy:    He has no cervical adenopathy.  Neurological: He is alert. No cranial nerve deficit.  Skin: Skin is warm and dry.  Psychiatric: He has a normal mood and affect. His behavior is normal. His speech is tangential. Cognition and memory are impaired. He expresses no suicidal ideation. He expresses no suicidal plans.    ED Course  Procedures (including critical care time) Labs Review Labs Reviewed  CBC - Abnormal; Notable for the following:    WBC 10.6 (*)    All other components within normal limits  ACETAMINOPHEN LEVEL  COMPREHENSIVE METABOLIC PANEL  ETHANOL  SALICYLATE LEVEL  URINE RAPID DRUG SCREEN (HOSP PERFORMED)    Imaging Review Ct Abdomen Pelvis Wo Contrast  04/07/2014   CLINICAL DATA:  Cough, 10# wt loss and diarrhea x 2 months. Nonsmoker. No prev chest or abd surg. No hx ca. No prev CT.  EXAM: CT CHEST, ABDOMEN AND PELVIS WITHOUT CONTRAST  TECHNIQUE: Multidetector CT imaging of the chest, abdomen and pelvis was performed following the standard  protocol without IV contrast.  COMPARISON:  None.  FINDINGS: CT CHEST FINDINGS  No neck base or axillary masses or adenopathy.  Heart is normal in size. Moderate coronary artery calcifications. Great vessels are normal in caliber. No mediastinal or hilar masses or adenopathy.  Focal irregular opacity in the right lower lobe at the posterior lung base measures 11 mm. This may reflect atelectasis or scarring. Neoplastic disease is not excluded but felt less likely. No other evidence of a lung mass. No discrete nodules.  There is coarse reticular and linear opacity in both lung bases, most consistent with atelectasis/ scarring. Remainder of the lungs is clear.  No pleural effusion.  No pneumothorax.  CT ABDOMEN AND PELVIS FINDINGS  Liver, spleen, gallbladder, pancreas, adrenal glands:  Unremarkable.  No renal masses or stones. No hydronephrosis. Ureters are normal course and in caliber.  Bladder is only mildly distended. The wall is thickened. No bladder mass or stone is seen. Prostate gland is borderline enlarged. Fat planes between the prostate, bladder base and seminal vesicles are well preserved.  There is low-attenuation homogeneous material within the right distal inguinal canal. This may reflect a retracted testicle. It may reflect fluid within the small hernia.  No pathologically enlarged lymph nodes. There is no ascites. There are atherosclerotic calcifications along a normal caliber abdominal aorta and the iliac vessels.  Scattered sigmoid colon diverticula. No diverticulitis. Colon otherwise unremarkable. Normal small bowel. Normal appendix is visualized.  MUSCULOSKELETAL:  No osteoblastic or osteolytic lesions. Bones are demineralized. There are degenerative changes throughout the visualized spine.  IMPRESSION: 1. No acute findings. 2. Focal opacity in the right lower lobe at the posterior lung base measuring 11 mm. Although this is most likely atelectasis and/or scarring, neoplastic disease is possible.  Recommend either followup PET-CT or short term CT follow-up with repeat unenhanced chest CT in 3 months. 3. No other evidence of neoplastic disease. 4. Bladder wall thickening which may be due to chronic bladder outlet obstruction, although the prostate gland is not appear overtly enlarged. 5. Scattered sigmoid colon diverticula. No diverticulitis or bowel inflammation. Cause of the patient's diarrhea is unclear.   Electronically Signed   By: Amie Portland M.D.   On: 04/07/2014 10:46   Ct Chest Wo Contrast  04/07/2014   CLINICAL DATA:  Cough, 10# wt loss and diarrhea x 2 months. Nonsmoker. No prev chest or abd surg. No hx ca. No prev CT.  EXAM: CT CHEST, ABDOMEN AND PELVIS WITHOUT CONTRAST  TECHNIQUE: Multidetector CT imaging of the chest, abdomen and pelvis was performed following the standard protocol without IV contrast.  COMPARISON:  None.  FINDINGS: CT CHEST FINDINGS  No neck base or axillary masses or adenopathy.  Heart is normal in size. Moderate coronary artery calcifications. Great vessels are normal in caliber. No mediastinal or hilar masses or adenopathy.  Focal irregular opacity in the right lower lobe at the posterior lung base measures 11 mm. This may reflect atelectasis or scarring. Neoplastic disease is not excluded but felt  less likely. No other evidence of a lung mass. No discrete nodules.  There is coarse reticular and linear opacity in both lung bases, most consistent with atelectasis/ scarring. Remainder of the lungs is clear.  No pleural effusion.  No pneumothorax.  CT ABDOMEN AND PELVIS FINDINGS  Liver, spleen, gallbladder, pancreas, adrenal glands:  Unremarkable.  No renal masses or stones. No hydronephrosis. Ureters are normal course and in caliber.  Bladder is only mildly distended. The wall is thickened. No bladder mass or stone is seen. Prostate gland is borderline enlarged. Fat planes between the prostate, bladder base and seminal vesicles are well preserved.  There is low-attenuation  homogeneous material within the right distal inguinal canal. This may reflect a retracted testicle. It may reflect fluid within the small hernia.  No pathologically enlarged lymph nodes. There is no ascites. There are atherosclerotic calcifications along a normal caliber abdominal aorta and the iliac vessels.  Scattered sigmoid colon diverticula. No diverticulitis. Colon otherwise unremarkable. Normal small bowel. Normal appendix is visualized.  MUSCULOSKELETAL:  No osteoblastic or osteolytic lesions. Bones are demineralized. There are degenerative changes throughout the visualized spine.  IMPRESSION: 1. No acute findings. 2. Focal opacity in the right lower lobe at the posterior lung base measuring 11 mm. Although this is most likely atelectasis and/or scarring, neoplastic disease is possible. Recommend either followup PET-CT or short term CT follow-up with repeat unenhanced chest CT in 3 months. 3. No other evidence of neoplastic disease. 4. Bladder wall thickening which may be due to chronic bladder outlet obstruction, although the prostate gland is not appear overtly enlarged. 5. Scattered sigmoid colon diverticula. No diverticulitis or bowel inflammation. Cause of the patient's diarrhea is unclear.   Electronically Signed   By: Amie Portlandavid  Ormond M.D.   On: 04/07/2014 10:46     EKG Interpretation None     Patient's labs reveal that he is slightly dehydrated.  He's been given IV fluids MDM  Patient is unable to give concise timeline for evaluation is unsure why he is in the emergency department.  Denies any recent illnesses, falls, although he does state that he fell last week.  He was seen by Dr. Dulce Sellaroutlaw and had a CT scan of his abdomen, pelvis and chest on the fourth.  The patient is unsure exactly why.  He states he's been taking his medication on a regular basis. When speaking to the patient.  He does not make eye contact.  He is very hard to understand as he mumbles Final diagnoses:  None          Arman FilterGail K Kennadi Albany, NP 04/12/14 [id number]  Tomasita CrumbleAdeleke Oni, MD 04/12/14 1346

## 2014-04-08 NOTE — ED Provider Notes (Signed)
6:05 AM: At end of shift, hand-off report received from Earley [name], NP-C.  Plan includes eval from TTS and follow recommendations. Patient has been medically cleared in the ED. Pt is currently not having SI or HI and appears stable in NAD.   7:05 AM:  According to TTS note, pt to be evaluated by psychiatry in am for disposition.    9:30 AM: Awaiting recommendations from Psych.  Breakfast provided for pt. He is alert and in no acute distress.   12:30: Recommend psychiatric inpatient admission to gero-psychiatric unit.  [facility] while awaiting gero-psych bed.    Harle BattiestElizabeth Bebe Moncure, NP 04/09/14 2129  Tomasita CrumbleAdeleke Oni, MD 04/10/14 1406

## 2014-04-08 NOTE — ED Notes (Signed)
Pt attempted to get out of bed. Repositioned patient.

## 2014-04-08 NOTE — ED Notes (Signed)
BH counselor at bedside 

## 2014-04-08 NOTE — BH Assessment (Signed)
Relayed results of assessment to Donell SievertSpencer Simon, PA. Per Donell SievertSpencer Simon, PA pt needs AM psyc evaluation to determine final disposition.   Attempted to discuss plans with Earley [name] but she has left for the day. Prior to assessment discussed potential for AM psyc evaluation due to complexity of presentation and she was in agreement at that time.   Clista BernhardtNancy Lateef Juncaj, [facility] Triage Specialist 04/08/2014 6:43 AM

## 2014-04-08 NOTE — ED Notes (Signed)
MD at bedside. TTS PRESENT TO SPEAK WITH THIS PT

## 2014-04-08 NOTE — ED Notes (Signed)
Patient just looks up at the ceiling. He makes no noise.

## 2014-04-08 NOTE — ED Notes (Signed)
POSEY VEST INTACT TO ASSIST WITH SAFETY. CHARGE STACEY RN MADE AWARE OF NEED FOR SAFETY SITTER FOR THIS PT AS HE HAS MADE SEVERAL ATTEMPTS TO GET OOB. PT HAS NEEDED MULTIPLE RESOURCES TO MAINTAIN SAFETY. ACRN JENNIFER MADE AWARE. STAFFING OFFICE ALSO MADE AWARE OF NEED.

## 2014-04-08 NOTE — BH Assessment (Signed)
Per Earley [name], NP pt is a little hypotensive, and dehydrated and being treated. Vague past mental health history, takes haldol and Abilify but dx is not is confirmed. Pt speaks tangentially when questioned. Had recent medical test but is unclear what out patient doctor was looking for. [facility] girl friend and then brought to ED for evaluation.    Assessment to commence shortly.   Clista BernhardtNancy Mayzie Caughlin, [facility] Triage Specialist 04/08/2014 6:07 AM

## 2014-04-08 NOTE — ED Notes (Signed)
Patient denies being in any pain. He appears in no distress. No moaning or guarding. When asking patient the [facility] , he stated "just to see the scenery".

## 2014-04-08 NOTE — Consult Note (Signed)
[facility] Face-to-Face Psychiatry Consult   Reason for Consult:   Referring Physician:  EDP Jeffrey Nguyen is an 79 y.o. male. Total Time spent with patient: 20 minutes  Assessment DSM   Past Medical History  Diagnosis Date  . Bipolar affective disorder   . Depression   . Hypertension   . Hyperlipidemia   . Sleep apnea   . BPH (benign prostatic hyperplasia)   . Hypoxemia   . Pneumonia   . AAA (abdominal aortic aneurysm) 10/13      Plan:  Recommend psychiatric Inpatient admission when medically cleared. Gero-psychiatric unit  Subjective:   Jeffrey Nguyen is a 79 y.o. male patient admitted with Mood disorder, R/O Dementia  HPI:  Caucasian male, 79 years old was seen for assessment of mood disorder and possible Dementia.  Please see note by TTS staff.  This am, patient was not coherent enough to answer all questions.  He stated that he occasionally hears voices.  Patient stated that he did not know why he was brought to the hospital.  He stated that he live in a home with a male friend and that he does not have children.  His Nurse stated that patient has not been talking to her and that she could not obtain meaningful information from patient.RN stated that Patient was dropped off at [facility] by a male who left and [facility] staff brought patient to the ER.  We have accepted patient for admission for more detailed evaluation.  Patient will be transferred to Gero-psychiatric facility as soon as bed is available.   Patient qualifies for IVC based on the degree of his illness and his inability to make decision for his treatment and for his safety.  Patient will be transferred when bed is available.  HPI Elements:   Location:  r/o Dementia, mood disorder. Quality:  unable to obtain at this time. Severity:  severe. Timing:  acute. Duration:  unknown. Context:  Brought in by [facility] for evaluation..  Past Psychiatric History: Past Medical History  Diagnosis Date  . Bipolar affective  disorder   . Depression   . Hypertension   . Hyperlipidemia   . Sleep apnea   . BPH (benign prostatic hyperplasia)   . Hypoxemia   . Pneumonia   . AAA (abdominal aortic aneurysm) 10/13    reports that he has quit smoking. He does not have any smokeless tobacco history on file. He reports that he drinks alcohol. He reports that he does not use illicit drugs. Family History  Problem Relation Age of Onset  . Multiple sclerosis Sister    Family History Substance Abuse: No Family Supports: No Living Arrangements: Other (Comment) [name], SO) Can pt return to current living arrangement?: Yes Abuse/Neglect [facility]) Physical Abuse: Denies Verbal Abuse: Denies Sexual Abuse: Denies Allergies:  No Known Allergies  ACT Assessment Complete:  Yes:    Educational Status    Risk to Self: Risk to self with the past 6 months Suicidal Ideation: No-Not Currently/Within Last 6 Months Suicidal Intent: No-Not Currently/Within Last 6 Months (reports last attempted in June) Is patient at risk for suicide?: No Suicidal Plan?:  (UTA) Access to Means: No What has been your use of drugs/alcohol within the last 12 months?: None Previous Attempts/Gestures: Yes How many times?: 4 (unclear if this is accurate unable to provide details) Other Self Harm Risks: falls frequently  Triggers for Past Attempts: Unknown Intentional Self Injurious Behavior: Bruising (reports falls,  when asked about hurting self intentionally )  Comment - Self Injurious Behavior: falls frequently reported this when asked if he hurts himself on purpose Family Suicide History: No Recent stressful life event(s): Other (Comment) (denies) Persecutory voices/beliefs?: No Depression: No ("none right now") Depression Symptoms:  (none) Substance abuse history and/or treatment for substance abuse?: No Suicide prevention information given to non-admitted patients: Yes  Risk to Others: Risk to Others within the past 6 months Homicidal  Ideation: No Thoughts of Harm to Others: No Current Homicidal Intent: No Current Homicidal Plan: No Access to Homicidal Means: No Identified Victim: none History of harm to others?: No Assessment of Violence: None Noted Violent Behavior Description: none Does patient have access to weapons?: No Criminal Charges Pending?: No Does patient have a court date: No  Abuse: Abuse/Neglect Assessment (Assessment to be complete while patient is alone) Physical Abuse: Denies Verbal Abuse: Denies Sexual Abuse: Denies Exploitation of patient/patient's resources: Denies Self-Neglect: Denies  Prior Inpatient Therapy: Prior Inpatient Therapy Prior Inpatient Therapy: No Prior Therapy Dates: NA Prior Therapy Facilty/Provider(s): NA Reason for Treatment: NA  Prior Outpatient Therapy: Prior Outpatient Therapy Prior Outpatient Therapy: Yes Prior Therapy Dates: current Prior Therapy Facilty/Provider(s): Dr. Reece Levy Reason for Treatment: medication management  Additional Information: Additional Information 1:1 In Past 12 Months?: No CIRT Risk: Yes Elopement Risk: Yes Does patient have medical clearance?: No   Objective: Blood pressure 112/58, pulse 86, temperature 98.2 F (36.8 C), temperature source Oral, resp. rate 18, SpO2 94 %.There is no height or weight on file to calculate BMI. Results for orders placed or performed during the hospital encounter of 04/08/14 (from the past 72 hour(s))  Acetaminophen level     Status: Abnormal   Collection Time: 04/08/14  4:01 AM  Result Value Ref Range   Acetaminophen (Tylenol), Serum <10.0 (L) 10 - 30 ug/mL    Comment:        THERAPEUTIC CONCENTRATIONS VARY SIGNIFICANTLY. A RANGE OF 10-30 ug/mL MAY BE AN EFFECTIVE CONCENTRATION FOR MANY PATIENTS. HOWEVER, SOME ARE BEST TREATED AT CONCENTRATIONS OUTSIDE THIS RANGE. ACETAMINOPHEN CONCENTRATIONS >150 ug/mL AT 4 HOURS AFTER INGESTION AND >50 ug/mL AT 12 HOURS AFTER INGESTION ARE OFTEN ASSOCIATED WITH  TOXIC REACTIONS.   CBC     Status: Abnormal   Collection Time: 04/08/14  4:01 AM  Result Value Ref Range   WBC 10.6 (H) 4.0 - 10.5 K/uL   RBC 5.22 4.22 - 5.81 MIL/uL   Hemoglobin 15.2 13.0 - 17.0 g/dL   HCT 45.5 39.0 - 52.0 %   MCV 87.2 78.0 - 100.0 fL   MCH 29.1 26.0 - 34.0 pg   MCHC 33.4 30.0 - 36.0 g/dL   RDW 14.5 11.5 - 15.5 %   Platelets 243 150 - 400 K/uL  Comprehensive metabolic panel     Status: Abnormal   Collection Time: 04/08/14  4:01 AM  Result Value Ref Range   Sodium 137 135 - 145 mmol/L    Comment: Please note change in reference range.   Potassium 4.2 3.5 - 5.1 mmol/L    Comment: Please note change in reference range.   Chloride 104 96 - 112 mEq/L   CO2 21 19 - 32 mmol/L   Glucose, Bld 123 (H) 70 - 99 mg/dL   BUN 28 (H) 6 - 23 mg/dL   Creatinine, Ser 1.88 (H) 0.50 - 1.35 mg/dL   Calcium 9.0 8.4 - 10.5 mg/dL   Total Protein 6.8 6.0 - 8.3 g/dL   Albumin 4.1 3.5 - 5.2 g/dL   AST 24 0 -  37 U/L   ALT 20 0 - 53 U/L   Alkaline Phosphatase 84 39 - 117 U/L   Total Bilirubin 0.7 0.3 - 1.2 mg/dL   GFR calc non Af Amer 33 (L) >90 mL/min   GFR calc Af Amer 38 (L) >90 mL/min    Comment: (NOTE) The eGFR has been calculated using the CKD EPI equation. This calculation has not been validated in all clinical situations. eGFR's persistently <90 mL/min signify possible Chronic Kidney Disease.    Anion gap 12 5 - 15  Ethanol (ETOH)     Status: None   Collection Time: 04/08/14  4:01 AM  Result Value Ref Range   Alcohol, Ethyl (B) <5 0 - 9 mg/dL    Comment:        LOWEST DETECTABLE LIMIT FOR SERUM ALCOHOL IS 11 mg/dL FOR MEDICAL PURPOSES ONLY   Salicylate level     Status: None   Collection Time: 04/08/14  4:01 AM  Result Value Ref Range   Salicylate Lvl <3.1 2.8 - 20.0 mg/dL  Urine Drug Screen     Status: Abnormal   Collection Time: 04/08/14  5:03 AM  Result Value Ref Range   Opiates NONE DETECTED NONE DETECTED   Cocaine NONE DETECTED NONE DETECTED    Benzodiazepines POSITIVE (A) NONE DETECTED   Amphetamines NONE DETECTED NONE DETECTED   Tetrahydrocannabinol NONE DETECTED NONE DETECTED   Barbiturates NONE DETECTED NONE DETECTED    Comment:        DRUG SCREEN FOR MEDICAL PURPOSES ONLY.  IF CONFIRMATION IS NEEDED FOR ANY PURPOSE, NOTIFY LAB WITHIN 5 DAYS.        LOWEST DETECTABLE LIMITS FOR URINE DRUG SCREEN Drug Class       Cutoff (ng/mL) Amphetamine      1000 Barbiturate      200 Benzodiazepine   517 Tricyclics       616 Opiates          300 Cocaine          300 THC              50   Urinalysis, Routine w reflex microscopic     Status: Abnormal   Collection Time: 04/08/14  7:45 AM  Result Value Ref Range   Color, Urine AMBER (A) YELLOW    Comment: BIOCHEMICALS MAY BE AFFECTED BY COLOR   APPearance CLOUDY (A) CLEAR   Specific Gravity, Urine 1.024 1.005 - 1.030   pH 5.5 5.0 - 8.0   Glucose, UA NEGATIVE NEGATIVE mg/dL   Hgb urine dipstick SMALL (A) NEGATIVE   Bilirubin Urine SMALL (A) NEGATIVE   Ketones, ur NEGATIVE NEGATIVE mg/dL   Protein, ur NEGATIVE NEGATIVE mg/dL   Urobilinogen, UA 1.0 0.0 - 1.0 mg/dL   Nitrite NEGATIVE NEGATIVE   Leukocytes, UA NEGATIVE NEGATIVE  Urine microscopic-add on     Status: Abnormal   Collection Time: 04/08/14  7:45 AM  Result Value Ref Range   WBC, UA 0-2 <3 WBC/hpf   RBC / HPF 7-10 <3 RBC/hpf   Casts HYALINE CASTS (A) NEGATIVE   Urine-Other MUCOUS PRESENT    Labs are reviewed and are pertinent for UDS is positive for Benzos, Abnormal kidney function rest  No current facility-administered medications for this encounter.   Current Outpatient Prescriptions  Medication Sig Dispense Refill  . aspirin 325 MG tablet Take 325 mg by mouth daily.    Marland Kitchen atorvastatin (LIPITOR) 40 MG tablet Take 40 mg by mouth daily.    Marland Kitchen  benztropine (COGENTIN) 1 MG tablet Take 1 mg by mouth 2 (two) times daily.    Marland Kitchen buPROPion (WELLBUTRIN XL) 150 MG 24 hr tablet Take 150 mg by mouth daily.    . haloperidol  decanoate (HALDOL DECANOATE) 100 MG/ML injection Inject 50 mg into the muscle every 28 (twenty-eight) days.      Marland Kitchen LORazepam (ATIVAN) 1 MG tablet Take 1 mg by mouth 2 (two) times daily as needed for anxiety.     Marland Kitchen thiothixene (NAVANE) 5 MG capsule Take 2-3 capsules by mouth 2 (two) times daily. Take 2 capsules in the morning and take 3 capsules at night.  0  . venlafaxine XR (EFFEXOR-XR) 150 MG 24 hr capsule Take 150 mg by mouth 2 (two) times daily.      Psychiatric Specialty Exam:     Blood pressure 112/58, pulse 86, temperature 98.2 F (36.8 C), temperature source Oral, resp. rate 18, SpO2 94 %.There is no height or weight on file to calculate BMI.  General Appearance: Casual  Eye Contact::  None  Speech:  Garbled and Slow  Volume:  Decreased  Mood:  Depressed  Affect:  Congruent, Depressed and Flat  Thought Process:  unable to ascertain  Orientation:  Other:  unable to ascertain  Thought Content:  unable to obtain  Suicidal Thoughts:  No  Homicidal Thoughts:  No  Memory:  Unable to ascertain  Judgement:  Other:  unable to ascertain  Insight:  unable to ascertain  Psychomotor Activity:  Normal  Concentration:  Fair  Recall:  unable to ascertain  Benjamin Perez to ascertain  Language: Fair  Akathisia:  NA  Handed:  Right  AIMS (if indicated):     Assets:  Desire for Improvement  Sleep:      Musculoskeletal: Strength & Muscle Tone: unable to ascertain, lying down in bed Gait & Station: unable to elucidate, seen in bed Patient leans: seen in bed   Treatment Plan Summary: Daily contact with patient to assess and evaluate symptoms and progress in treatment Medication management Seek treatment at a Gero-psychiatric facility  Charmaine Downs, Loletha Grayer   PMHNP-BC 04/08/2014 12:28 PM  Patient seen, evaluated and I agree with notes by Nurse Practitioner. Corena Pilgrim, MD

## 2014-04-08 NOTE — BHH Counselor (Addendum)
TTS Counselor checked on pending referrals for inpt treatment, with the following results:  Pending: [facility] - per Amy, referral has been received and is in review now. Beds will be available on morning of 04/09/14.  [facility] - per Victorino DikeJennifer, referral received. Decision pending. Beds will be available on morning of 04/09/14.  No response: [facility]   - Cyndie MullAnna Kingjames Coury, [facility]  Triage Specialist

## 2014-04-08 NOTE — BH Assessment (Signed)
Writer informed TTS Harriett SineNancy and [employer] of the consult.

## 2014-04-08 NOTE — ED Notes (Signed)
Police brought pt over from [facility]  Pt is voluntary  Pt denies SI/HI at this time  Pt states he is unsure why he is here  Pt has hx of HTN, bipolar, schizophrenia, and depression  Pt uses a walker and has hx of falling lately  Pt has an old laceration to the back of his head from a few days ago

## 2014-04-09 ENCOUNTER — Emergency Department (HOSPITAL_COMMUNITY): Payer: Medicare Other

## 2014-04-09 DIAGNOSIS — F0390 Unspecified dementia without behavioral disturbance: Secondary | ICD-10-CM | POA: Diagnosis present

## 2014-04-09 DIAGNOSIS — J189 Pneumonia, unspecified organism: Secondary | ICD-10-CM | POA: Diagnosis present

## 2014-04-09 DIAGNOSIS — F329 Major depressive disorder, single episode, unspecified: Secondary | ICD-10-CM | POA: Diagnosis present

## 2014-04-09 DIAGNOSIS — I714 Abdominal aortic aneurysm, without rupture: Secondary | ICD-10-CM | POA: Diagnosis present

## 2014-04-09 DIAGNOSIS — I10 Essential (primary) hypertension: Secondary | ICD-10-CM | POA: Diagnosis present

## 2014-04-09 DIAGNOSIS — J9601 Acute respiratory failure with hypoxia: Secondary | ICD-10-CM | POA: Diagnosis present

## 2014-04-09 DIAGNOSIS — E785 Hyperlipidemia, unspecified: Secondary | ICD-10-CM | POA: Diagnosis present

## 2014-04-09 DIAGNOSIS — F05 Delirium due to known physiological condition: Secondary | ICD-10-CM | POA: Diagnosis present

## 2014-04-09 DIAGNOSIS — G4730 Sleep apnea, unspecified: Secondary | ICD-10-CM

## 2014-04-09 DIAGNOSIS — R45851 Suicidal ideations: Secondary | ICD-10-CM | POA: Diagnosis present

## 2014-04-09 DIAGNOSIS — G4733 Obstructive sleep apnea (adult) (pediatric): Secondary | ICD-10-CM | POA: Diagnosis present

## 2014-04-09 DIAGNOSIS — R4182 Altered mental status, unspecified: Secondary | ICD-10-CM | POA: Insufficient documentation

## 2014-04-09 DIAGNOSIS — F319 Bipolar disorder, unspecified: Secondary | ICD-10-CM

## 2014-04-09 DIAGNOSIS — Z79899 Other long term (current) drug therapy: Secondary | ICD-10-CM | POA: Diagnosis not present

## 2014-04-09 DIAGNOSIS — R0902 Hypoxemia: Secondary | ICD-10-CM

## 2014-04-09 DIAGNOSIS — Z7982 Long term (current) use of aspirin: Secondary | ICD-10-CM | POA: Diagnosis not present

## 2014-04-09 DIAGNOSIS — G9340 Encephalopathy, unspecified: Secondary | ICD-10-CM | POA: Diagnosis present

## 2014-04-09 DIAGNOSIS — Z87891 Personal history of nicotine dependence: Secondary | ICD-10-CM | POA: Diagnosis not present

## 2014-04-09 DIAGNOSIS — R410 Disorientation, unspecified: Secondary | ICD-10-CM | POA: Diagnosis present

## 2014-04-09 DIAGNOSIS — N400 Enlarged prostate without lower urinary tract symptoms: Secondary | ICD-10-CM | POA: Diagnosis present

## 2014-04-09 LAB — CBC WITH DIFFERENTIAL/PLATELET
BASOS ABS: 0 10*3/uL (ref 0.0–0.1)
Basophils Relative: 1 % (ref 0–1)
EOS ABS: 0.6 10*3/uL (ref 0.0–0.7)
Eosinophils Relative: 9 % — ABNORMAL HIGH (ref 0–5)
HEMATOCRIT: 39.6 % (ref 39.0–52.0)
Hemoglobin: 12.7 g/dL — ABNORMAL LOW (ref 13.0–17.0)
LYMPHS ABS: 1.5 10*3/uL (ref 0.7–4.0)
Lymphocytes Relative: 23 % (ref 12–46)
MCH: 28.3 pg (ref 26.0–34.0)
MCHC: 32.1 g/dL (ref 30.0–36.0)
MCV: 88.4 fL (ref 78.0–100.0)
MONO ABS: 0.8 10*3/uL (ref 0.1–1.0)
Monocytes Relative: 11 % (ref 3–12)
Neutro Abs: 3.9 10*3/uL (ref 1.7–7.7)
Neutrophils Relative %: 56 % (ref 43–77)
PLATELETS: 221 10*3/uL (ref 150–400)
RBC: 4.48 MIL/uL (ref 4.22–5.81)
RDW: 14.7 % (ref 11.5–15.5)
WBC: 6.8 10*3/uL (ref 4.0–10.5)

## 2014-04-09 LAB — BASIC METABOLIC PANEL
Anion gap: 7 (ref 5–15)
BUN: 18 mg/dL (ref 6–23)
CO2: 26 mmol/L (ref 19–32)
CREATININE: 1.26 mg/dL (ref 0.50–1.35)
Calcium: 8.8 mg/dL (ref 8.4–10.5)
Chloride: 107 mEq/L (ref 96–112)
GFR calc non Af Amer: 54 mL/min — ABNORMAL LOW (ref >90)
GFR, EST AFRICAN AMERICAN: 62 mL/min — AB (ref >90)
GLUCOSE: 99 mg/dL (ref 70–99)
POTASSIUM: 4.1 mmol/L (ref 3.5–5.1)
Sodium: 140 mmol/L (ref 135–145)

## 2014-04-09 MED ORDER — ENOXAPARIN SODIUM 30 MG/0.3ML ~~LOC~~ SOLN
30.0000 mg | Freq: Every day | SUBCUTANEOUS | Status: DC
  Administered 2014-04-10: 30 mg via SUBCUTANEOUS
  Filled 2014-04-09 (×2): qty 0.3

## 2014-04-09 MED ORDER — ONDANSETRON HCL 4 MG PO TABS: 4.0000 mg | ORAL_TABLET | Freq: Four times a day (QID) | ORAL | Status: DC | PRN

## 2014-04-09 MED ORDER — LEVOFLOXACIN IN D5W 750 MG/150ML IV SOLN
750.0000 mg | Freq: Once | INTRAVENOUS | Status: AC
  Administered 2014-04-09: 750 mg via INTRAVENOUS
  Filled 2014-04-09: qty 150

## 2014-04-09 MED ORDER — HALOPERIDOL LACTATE 5 MG/ML IJ SOLN: 1.0000 mg | Freq: Four times a day (QID) | INTRAMUSCULAR | Status: DC | PRN

## 2014-04-09 MED ORDER — DIPHENHYDRAMINE HCL 50 MG/ML IJ SOLN: 12.5000 mg | Freq: Four times a day (QID) | INTRAMUSCULAR | Status: DC | PRN

## 2014-04-09 MED ORDER — ACETAMINOPHEN 650 MG RE SUPP: 650.0000 mg | Freq: Four times a day (QID) | RECTAL | Status: DC | PRN

## 2014-04-09 MED ORDER — LORAZEPAM 2 MG/ML IJ SOLN
1.0000 mg | INTRAMUSCULAR | Status: DC | PRN
  Administered 2014-04-11 – 2014-04-12 (×2): 1 mg via INTRAVENOUS
  Filled 2014-04-09 (×2): qty 1

## 2014-04-09 MED ORDER — LEVOFLOXACIN IN D5W 500 MG/100ML IV SOLN
500.0000 mg | INTRAVENOUS | Status: DC
  Administered 2014-04-10 – 2014-04-11 (×2): 500 mg via INTRAVENOUS
  Filled 2014-04-09 (×2): qty 100

## 2014-04-09 MED ORDER — ACETAMINOPHEN 325 MG PO TABS: 650.0000 mg | ORAL_TABLET | Freq: Four times a day (QID) | ORAL | Status: DC | PRN

## 2014-04-09 MED ORDER — ALBUTEROL SULFATE (2.5 MG/3ML) 0.083% IN NEBU
2.5000 mg | INHALATION_SOLUTION | Freq: Four times a day (QID) | RESPIRATORY_TRACT | Status: DC
  Administered 2014-04-10: 2.5 mg via RESPIRATORY_TRACT
  Filled 2014-04-09: qty 3

## 2014-04-09 MED ORDER — ONDANSETRON HCL 4 MG/2ML IJ SOLN: 4.0000 mg | Freq: Four times a day (QID) | INTRAMUSCULAR | Status: DC | PRN

## 2014-04-09 MED ORDER — LORAZEPAM 1 MG PO TABS
1.0000 mg | ORAL_TABLET | Freq: Once | ORAL | Status: AC
  Administered 2014-04-09: 1 mg via ORAL

## 2014-04-09 MED ORDER — SODIUM CHLORIDE 0.9 % IV SOLN
INTRAVENOUS | Status: DC
  Administered 2014-04-09: 500 mL via INTRAVENOUS
  Administered 2014-04-10: 1000 mL via INTRAVENOUS
  Administered 2014-04-10: 21:00:00 via INTRAVENOUS

## 2014-04-09 MED ORDER — THIOTHIXENE 5 MG PO CAPS
5.0000 mg | ORAL_CAPSULE | Freq: Two times a day (BID) | ORAL | Status: DC
  Administered 2014-04-09: 5 mg via ORAL
  Filled 2014-04-09: qty 1

## 2014-04-09 MED ORDER — HYDROMORPHONE HCL 1 MG/ML IJ SOLN
0.5000 mg | INTRAMUSCULAR | Status: DC | PRN
  Administered 2014-04-11 (×2): 1 mg via INTRAVENOUS
  Filled 2014-04-09 (×2): qty 1

## 2014-04-09 MED ORDER — TRIHEXYPHENIDYL HCL 2 MG PO TABS
2.0000 mg | ORAL_TABLET | Freq: Two times a day (BID) | ORAL | Status: DC
  Administered 2014-04-09: 2 mg via ORAL
  Filled 2014-04-09 (×2): qty 1

## 2014-04-09 NOTE — ED Notes (Signed)
Pt up stairs to the floor with sitter. Press photographerCharge nurse and house supervisor informed.

## 2014-04-09 NOTE — ED Provider Notes (Signed)
8:41 PM has been having cough, especially after feeding. Nurse feels he's more altered today. Patient only mumbles when I talk to him, unable to understand patient. No respiratory distress. Tech notes he coughed a lot with feeding today.  10:35 PM CXR concerning for infiltrate, O2 sats borderline at 91%. No distress, but nurse who has been taking care of him last 2 days feels he is more confused. D/w Dr. Lovell SheehanJenkins, will get blood cultures and give levaquin (possible aspiration) and admit to med-surg  Audree CamelScott T Audryana Hockenberry, MD 04/09/14 [id number]

## 2014-04-09 NOTE — BH Assessment (Signed)
BHH Assessment Progress Note  Pt has been referred to the following facilities with results as noted:  *[facility]: at capacity *[facility]: at capacity [employer]*[facility]: no beds available, but they will accept referral for their wait list; information has been faxed. *[facility]: at capacity [employer]*[facility]: no male beds available *[facility]: referral was made earlier and they have received it, but will not have beds until tomorrow.  Doylene Canninghomas Apolinar Bero, MA Triage Specialist 04/09/2014 @ 16:48

## 2014-04-09 NOTE — Consult Note (Signed)
[name] Face-to-Face Psychiatry Consult   Reason for Consult:   Referring Physician:  EDP ELI Nguyen is an 79 y.o. male. Total Time spent with patient: 20 minutes  Assessment DSM   Past Medical History  Diagnosis Date  . Bipolar affective disorder   . Depression   . Hypertension   . Hyperlipidemia   . Sleep apnea   . BPH (benign prostatic hyperplasia)   . Hypoxemia   . Pneumonia   . AAA (abdominal aortic aneurysm) 10/13      Plan:  Recommend psychiatric Inpatient admission when medically cleared. Gero-psychiatric unit  Subjective:   Jeffrey Nguyen is a 79 y.o. male patient admitted with Mood disorder, R/O Dementia  HPI:  Caucasian male, 79 years old was seen for assessment of mood disorder and possible Dementia.  Please see note by TTS staff.  This am, patient was not coherent enough to answer all questions.  He stated that he occasionally hears voices.  Patient stated that he did not know why he was brought to the hospital.  He stated that he live in a home with a male friend and that he does not have children.  His Nurse stated that patient has not been talking to her and that she could not obtain meaningful information from patient.RN stated that Patient was dropped off at [facility] by a male who left and [facility] staff brought patient to the ER.  We have accepted patient for admission for more detailed evaluation.  Patient will be transferred to Gero-psychiatric facility as soon as bed is available.   Patient qualifies for IVC based on the degree of his illness and his inability to make decision for his treatment and for his safety.  Patient will be transferred when bed is available. Today patient was seen in bed with a sitter monitoring him.  He is not agitated or agressive but he is disoriented and confused.  Patient constantly tries to get up and out of his bed despite his unsteady gait.  Patient could not answer any question asked of him.  He is able to eat and swallow his  medications.  His medications were  Changed based on his presentation.  His Haldol decanoate will be held unit further notice.  We will continue to monitor patient while we wait for available bed for admission.  HPI Elements:   Location:  r/o Dementia, mood disorder. Quality:  unable to obtain at this time. Severity:  severe. Timing:  acute. Duration:  unknown. Context:  Brought in by [facility] for evaluation..  Past Psychiatric History: Past Medical History  Diagnosis Date  . Bipolar affective disorder   . Depression   . Hypertension   . Hyperlipidemia   . Sleep apnea   . BPH (benign prostatic hyperplasia)   . Hypoxemia   . Pneumonia   . AAA (abdominal aortic aneurysm) 10/13    reports that he has quit smoking. He does not have any smokeless tobacco history on file. He reports that he drinks alcohol. He reports that he does not use illicit drugs. Family History  Problem Relation Age of Onset  . Multiple sclerosis Sister    Family History Substance Abuse: No Family Supports: No Living Arrangements: Other (Comment) [name], SO) Can pt return to current living arrangement?: Yes Abuse/Neglect [facility]) Physical Abuse: Denies Verbal Abuse: Denies Sexual Abuse: Denies Allergies:  No Known Allergies  ACT Assessment Complete:  Yes:    Educational Status    Risk to Self: Risk to  self with the past 6 months Suicidal Ideation: No-Not Currently/Within Last 6 Months Suicidal Intent: No-Not Currently/Within Last 6 Months (reports last attempted in June) Is patient at risk for suicide?: No Suicidal Plan?:  (UTA) Access to Means: No What has been your use of drugs/alcohol within the last 12 months?: None Previous Attempts/Gestures: Yes How many times?: 4 (unclear if this is accurate unable to provide details) Other Self Harm Risks: falls frequently  Triggers for Past Attempts: Unknown Intentional Self Injurious Behavior: Bruising (reports falls,  when asked about hurting self  intentionally ) Comment - Self Injurious Behavior: falls frequently reported this when asked if he hurts himself on purpose Family Suicide History: No Recent stressful life event(s): Other (Comment) (denies) Persecutory voices/beliefs?: No Depression: No ("none right now") Depression Symptoms:  (none) Substance abuse history and/or treatment for substance abuse?: No Suicide prevention information given to non-admitted patients: Yes  Risk to Others: Risk to Others within the past 6 months Homicidal Ideation: No Thoughts of Harm to Others: No Current Homicidal Intent: No Current Homicidal Plan: No Access to Homicidal Means: No Identified Victim: none History of harm to others?: No Assessment of Violence: None Noted Violent Behavior Description: none Does patient have access to weapons?: No Criminal Charges Pending?: No Does patient have a court date: No  Abuse: Abuse/Neglect Assessment (Assessment to be complete while patient is alone) Physical Abuse: Denies Verbal Abuse: Denies Sexual Abuse: Denies Exploitation of patient/patient's resources: Denies Self-Neglect: Denies  Prior Inpatient Therapy: Prior Inpatient Therapy Prior Inpatient Therapy: No Prior Therapy Dates: NA Prior Therapy Facilty/Provider(s): NA Reason for Treatment: NA  Prior Outpatient Therapy: Prior Outpatient Therapy Prior Outpatient Therapy: Yes Prior Therapy Dates: current Prior Therapy Facilty/Provider(s): Dr. Reece Levy Reason for Treatment: medication management  Additional Information: Additional Information 1:1 In Past 12 Months?: No CIRT Risk: Yes Elopement Risk: Yes Does patient have medical clearance?: No   Objective: Blood pressure 125/68, pulse 88, temperature 97.7 F (36.5 C), temperature source Axillary, resp. rate 16, SpO2 93 %.There is no height or weight on file to calculate BMI. Results for orders placed or performed during the hospital encounter of 04/08/14 (from the past 72 hour(s))   Acetaminophen level     Status: Abnormal   Collection Time: 04/08/14  4:01 AM  Result Value Ref Range   Acetaminophen (Tylenol), Serum <10.0 (L) 10 - 30 ug/mL    Comment:        THERAPEUTIC CONCENTRATIONS VARY SIGNIFICANTLY. A RANGE OF 10-30 ug/mL MAY BE AN EFFECTIVE CONCENTRATION FOR MANY PATIENTS. HOWEVER, SOME ARE BEST TREATED AT CONCENTRATIONS OUTSIDE THIS RANGE. ACETAMINOPHEN CONCENTRATIONS >150 ug/mL AT 4 HOURS AFTER INGESTION AND >50 ug/mL AT 12 HOURS AFTER INGESTION ARE OFTEN ASSOCIATED WITH TOXIC REACTIONS.   CBC     Status: Abnormal   Collection Time: 04/08/14  4:01 AM  Result Value Ref Range   WBC 10.6 (H) 4.0 - 10.5 K/uL   RBC 5.22 4.22 - 5.81 MIL/uL   Hemoglobin 15.2 13.0 - 17.0 g/dL   HCT 45.5 39.0 - 52.0 %   MCV 87.2 78.0 - 100.0 fL   MCH 29.1 26.0 - 34.0 pg   MCHC 33.4 30.0 - 36.0 g/dL   RDW 14.5 11.5 - 15.5 %   Platelets 243 150 - 400 K/uL  Comprehensive metabolic panel     Status: Abnormal   Collection Time: 04/08/14  4:01 AM  Result Value Ref Range   Sodium 137 135 - 145 mmol/L    Comment: Please note change  in reference range.   Potassium 4.2 3.5 - 5.1 mmol/L    Comment: Please note change in reference range.   Chloride 104 96 - 112 mEq/L   CO2 21 19 - 32 mmol/L   Glucose, Bld 123 (H) 70 - 99 mg/dL   BUN 28 (H) 6 - 23 mg/dL   Creatinine, Ser 1.88 (H) 0.50 - 1.35 mg/dL   Calcium 9.0 8.4 - 10.5 mg/dL   Total Protein 6.8 6.0 - 8.3 g/dL   Albumin 4.1 3.5 - 5.2 g/dL   AST 24 0 - 37 U/L   ALT 20 0 - 53 U/L   Alkaline Phosphatase 84 39 - 117 U/L   Total Bilirubin 0.7 0.3 - 1.2 mg/dL   GFR calc non Af Amer 33 (L) >90 mL/min   GFR calc Af Amer 38 (L) >90 mL/min    Comment: (NOTE) The eGFR has been calculated using the CKD EPI equation. This calculation has not been validated in all clinical situations. eGFR's persistently <90 mL/min signify possible Chronic Kidney Disease.    Anion gap 12 5 - 15  Ethanol (ETOH)     Status: None   Collection  Time: 04/08/14  4:01 AM  Result Value Ref Range   Alcohol, Ethyl (B) <5 0 - 9 mg/dL    Comment:        LOWEST DETECTABLE LIMIT FOR SERUM ALCOHOL IS 11 mg/dL FOR MEDICAL PURPOSES ONLY   Salicylate level     Status: None   Collection Time: 04/08/14  4:01 AM  Result Value Ref Range   Salicylate Lvl <8.2 2.8 - 20.0 mg/dL  Urine Drug Screen     Status: Abnormal   Collection Time: 04/08/14  5:03 AM  Result Value Ref Range   Opiates NONE DETECTED NONE DETECTED   Cocaine NONE DETECTED NONE DETECTED   Benzodiazepines POSITIVE (A) NONE DETECTED   Amphetamines NONE DETECTED NONE DETECTED   Tetrahydrocannabinol NONE DETECTED NONE DETECTED   Barbiturates NONE DETECTED NONE DETECTED    Comment:        DRUG SCREEN FOR MEDICAL PURPOSES ONLY.  IF CONFIRMATION IS NEEDED FOR ANY PURPOSE, NOTIFY LAB WITHIN 5 DAYS.        LOWEST DETECTABLE LIMITS FOR URINE DRUG SCREEN Drug Class       Cutoff (ng/mL) Amphetamine      1000 Barbiturate      200 Benzodiazepine   993 Tricyclics       716 Opiates          300 Cocaine          300 THC              50   Urinalysis, Routine w reflex microscopic     Status: Abnormal   Collection Time: 04/08/14  7:45 AM  Result Value Ref Range   Color, Urine AMBER (A) YELLOW    Comment: BIOCHEMICALS MAY BE AFFECTED BY COLOR   APPearance CLOUDY (A) CLEAR   Specific Gravity, Urine 1.024 1.005 - 1.030   pH 5.5 5.0 - 8.0   Glucose, UA NEGATIVE NEGATIVE mg/dL   Hgb urine dipstick SMALL (A) NEGATIVE   Bilirubin Urine SMALL (A) NEGATIVE   Ketones, ur NEGATIVE NEGATIVE mg/dL   Protein, ur NEGATIVE NEGATIVE mg/dL   Urobilinogen, UA 1.0 0.0 - 1.0 mg/dL   Nitrite NEGATIVE NEGATIVE   Leukocytes, UA NEGATIVE NEGATIVE  Urine microscopic-add on     Status: Abnormal   Collection Time: 04/08/14  7:45 AM  Result Value Ref Range   WBC, UA 0-2 <3 WBC/hpf   RBC / HPF 7-10 <3 RBC/hpf   Casts HYALINE CASTS (A) NEGATIVE   Urine-Other MUCOUS PRESENT    Labs are reviewed and are  pertinent for UDS is positive for Benzos, Abnormal kidney function rest  Current Facility-Administered Medications  Medication Dose Route Frequency Provider Last Rate Last Dose  . aspirin tablet 325 mg  325 mg Oral Daily Johnna Acosta, MD   325 mg at 04/09/14 0951  . atorvastatin (LIPITOR) tablet 40 mg  40 mg Oral Daily Johnna Acosta, MD   40 mg at 04/09/14 0951  . buPROPion (WELLBUTRIN XL) 24 hr tablet 150 mg  150 mg Oral Daily Johnna Acosta, MD   150 mg at 04/09/14 0951  . LORazepam (ATIVAN) tablet 1 mg  1 mg Oral BID PRN Johnna Acosta, MD   1 mg at 04/09/14 0951  . thiothixene (NAVANE) capsule 5 mg  5 mg Oral BID Atalya Dano      . trihexyphenidyl (ARTANE) tablet 2 mg  2 mg Oral BID WC Chet Greenley      . venlafaxine XR (EFFEXOR-XR) 24 hr capsule 300 mg  300 mg Oral QHS Johnna Acosta, MD   300 mg at 04/08/14 2135   Current Outpatient Prescriptions  Medication Sig Dispense Refill  . aspirin 325 MG tablet Take 325 mg by mouth daily.    Marland Kitchen atorvastatin (LIPITOR) 40 MG tablet Take 40 mg by mouth daily.    . benztropine (COGENTIN) 1 MG tablet Take 1 mg by mouth 2 (two) times daily.    Marland Kitchen buPROPion (WELLBUTRIN XL) 150 MG 24 hr tablet Take 150 mg by mouth daily.    . haloperidol decanoate (HALDOL DECANOATE) 100 MG/ML injection Inject 50 mg into the muscle every 28 (twenty-eight) days.      Marland Kitchen LORazepam (ATIVAN) 1 MG tablet Take 1 mg by mouth 2 (two) times daily as needed for anxiety.     Marland Kitchen thiothixene (NAVANE) 5 MG capsule Take 2-3 capsules by mouth 2 (two) times daily. Take 2 capsules in the morning and take 3 capsules at night.  0  . venlafaxine XR (EFFEXOR-XR) 150 MG 24 hr capsule Take 300 mg by mouth at bedtime.      Psychiatric Specialty Exam:     Blood pressure 125/68, pulse 88, temperature 97.7 F (36.5 C), temperature source Axillary, resp. rate 16, SpO2 93 %.There is no height or weight on file to calculate BMI.  General Appearance: Casual  Eye Contact::  None  Speech:   Garbled and Slow  Volume:  Decreased  Mood:  Depressed  Affect:  Congruent, Depressed and Flat  Thought Process:  unable to ascertain  Orientation:  Other:  unable to ascertain  Thought Content:  unable to obtain  Suicidal Thoughts:  No  Homicidal Thoughts:  No  Memory:  Unable to ascertain  Judgement:  Other:  unable to ascertain  Insight:  unable to ascertain  Psychomotor Activity:  Normal  Concentration:  Fair  Recall:  unable to ascertain  [place] to ascertain  Language: Fair  Akathisia:  NA  Handed:  Right  AIMS (if indicated):     Assets:  Desire for Improvement  Sleep:      Musculoskeletal: Strength & Muscle Tone: unable to ascertain, lying down in bed Gait & Station: unable to elucidate, seen in bed Patient leans: seen in bed   Treatment Plan Summary: Daily contact  with patient to assess and evaluate symptoms and progress in treatment Medication management Seek treatment at a Gero-psychiatric facility  Continue looking for a Gero-psychiatric facility.  Delfin Gant   PMHNP-BC 04/09/2014 3:12 PM   Patient seen, evaluated and I agree with notes by Nurse Practitioner. Corena Pilgrim, MD

## 2014-04-09 NOTE — H&P (Signed)
[facility] Admission History and Physical       Jeffrey Nguyen:[id number] DOB: 1936/01/10 DOA: 04/08/2014  Referring physician: EDP PCP: [name], MD  Specialists:   Chief Complaint: Confused  HPI: Jeffrey Nguyen is a 79 y.o. male with a history of HTN, Hyperlipidemia, Bipolar Disorder, OSA who was brought to the ED from the [facility] due to increased confusion and SI and HI.  He was sent to the ED and and IVC was submitted and ordered on 01/05. He was being further evaluated by [facility]/Psych.    He began to have increased lethargy, and a repeat chest X-ray was performed and revealed  A Right  > Left Airspace Opacities.   He was also found to have hypoxemia.   He was placed on IV Levaquin, and referred for medical admission.     Review of Systems:  Unable to Obtain from the Patient    Past Medical History  Diagnosis Date  . Bipolar affective disorder   . Depression   . Hypertension   . Hyperlipidemia   . Sleep apnea   . BPH (benign prostatic hyperplasia)   . Hypoxemia   . Pneumonia   . AAA (abdominal aortic aneurysm) 10/13    History reviewed. No pertinent past surgical history.     Prior to Admission medications   Medication Sig Start Date End Date Taking? Authorizing Provider  aspirin 325 MG tablet Take 325 mg by mouth daily.   Yes Historical Provider, MD  atorvastatin (LIPITOR) 40 MG tablet Take 40 mg by mouth daily.   Yes Historical Provider, MD  benztropine (COGENTIN) 1 MG tablet Take 1 mg by mouth 2 (two) times daily.   Yes Historical Provider, MD  buPROPion (WELLBUTRIN XL) 150 MG 24 hr tablet Take 150 mg by mouth daily.   Yes Historical Provider, MD  haloperidol decanoate (HALDOL DECANOATE) 100 MG/ML injection Inject 50 mg into the muscle every 28 (twenty-eight) days.     Yes Historical Provider, MD  LORazepam (ATIVAN) 1 MG tablet Take 1 mg by mouth 2 (two) times daily as needed for anxiety.    Yes Historical Provider, MD  thiothixene (NAVANE) 5  MG capsule Take 2-3 capsules by mouth 2 (two) times daily. Take 2 capsules in the morning and take 3 capsules at night. 04/01/14  Yes Historical Provider, MD  venlafaxine XR (EFFEXOR-XR) 150 MG 24 hr capsule Take 300 mg by mouth at bedtime.   Yes Historical Provider, MD      No Known Allergies   Social History:  reports that he has quit smoking. He does not have any smokeless tobacco history on file. He reports that he drinks alcohol. He reports that he does not use illicit drugs.     Family History  Problem Relation Age of Onset  . Multiple sclerosis Sister        Physical Exam:  GEN:  Elderly Well Nourished Well Developed Confused  79 y.o. Caucasian male examined and in no acute distress; cooperative with exam Filed Vitals:   04/09/14 1517 04/09/14 1940 04/09/14 2202 04/09/14 2327  BP: 129/100 109/73 111/87 143/90  Pulse: 86 86 85 85  Temp: 96.8 F (36 C) 97.1 F (36.2 C) 98.6 F (37 C) 98.6 F (37 C)  TempSrc: Axillary Oral Oral Oral  Resp: Weight:    88.9 kg (195 lb 15.8 oz)  SpO2: 99% 93% 91% 94%   Blood pressure 143/90, pulse 85, temperature 98.6 F (37 C),  temperature source Oral, resp. rate 14, height 0' (0 m), weight 88.9 kg (195 lb 15.8 oz), SpO2 94 %. PSYCH: He is alert and oriented x4; does not appear anxious does not appear depressed; affect is normal HEENT: Normocephalic and Atraumatic, Mucous membranes pink; PERRLA; EOM intact; Fundi:  Benign;  No scleral icterus, Nares: Patent, Oropharynx: Clear,    Neck:  FROM, No Cervical Lymphadenopathy nor Thyromegaly or Carotid Bruit; No JVD; Breasts:: Not examined CHEST WALL: No tenderness CHEST: Normal respiration, clear to auscultation bilaterally HEART: Regular rate and rhythm; no murmurs rubs or gallops BACK: No kyphosis or scoliosis; No CVA tenderness ABDOMEN: Positive Bowel Sounds, Soft Non-Tender; No Masses, No Organomegaly. Rectal Exam: Not done EXTREMITIES: No Cyanosis, Clubbing, or Edema; No  Ulcerations. Genitalia: not examined PULSES: 2+ and symmetric SKIN: Normal hydration no rash or ulceration CNS: Alert and Oriented x 1,  No Focal Deficits Vascular: pulses palpable throughout    Labs on Admission:  Basic Metabolic Panel:  Recent Labs Lab 04/08/14 0401 04/09/14 2054  NA 137 140  K 4.2 4.1  CL 104 107  CO2 21 26  GLUCOSE 123* 99  BUN 28* 18  CREATININE 1.88* 1.26  CALCIUM 9.0 8.8   Liver Function Tests:  Recent Labs Lab 04/08/14 0401  AST 24  ALT 20  ALKPHOS 84  BILITOT 0.7  PROT 6.8  ALBUMIN 4.1   No results for input(s): LIPASE, AMYLASE in the last 168 hours. No results for input(s): AMMONIA in the last 168 hours. CBC:  Recent Labs Lab 04/08/14 0401 04/09/14 2054  WBC 10.6* 6.8  NEUTROABS  --  3.9  HGB 15.2 12.7*  HCT 45.5 39.6  MCV 87.2 88.4  PLT 243 221   Cardiac Enzymes: No results for input(s): CKTOTAL, CKMB, CKMBINDEX, TROPONINI in the last 168 hours.  BNP (last 3 results) No results for input(s): PROBNP in the last 8760 hours. CBG: No results for input(s): GLUCAP in the last 168 hours.  Radiological Exams on Admission: Dg Chest 2 View  04/09/2014   CLINICAL DATA:  Confusion.  Dementia.  Cough.  EXAM: CHEST  2 VIEW  COMPARISON:  04/07/2014 CT chest  FINDINGS: Low lung volumes. Elevated right hemidiaphragm. Increasing airspace opacity at the lung bases, especially in the retrocardiac position on the left, causing poor definition of the left hemidiaphragm which was not present previously. The airspace opacity at the right lung bases bandlike probably from atelectasis.  Thoracic spondylosis. Scattered residual contrast medium within diverticula in the abdomen.  IMPRESSION: 1. Increased bibasilar airspace opacity, especially at the left lung base, compatible with atelectasis or pneumonia. 2. Thoracic spondylosis.   Electronically Signed   By: Herbie BaltimoreWalt  Liebkemann M.D.   On: 04/09/2014 21:18   Ct Head Wo Contrast  04/08/2014   CLINICAL DATA:   Altered mental status. History of hypertension, bipolar, schizophrenia, and depression. History of fall slightly. Old laceration to the back of the head from a few days ago.  EXAM: CT HEAD WITHOUT CONTRAST  TECHNIQUE: Contiguous axial images were obtained from the base of the skull through the vertex without intravenous contrast.  COMPARISON:  02/27/2014  FINDINGS: Mild diffuse cerebral atrophy. Low-attenuation changes in the deep white matter consistent with small vessel ischemia. No mass effect or midline shift. No abnormal extra-axial fluid collections. Gray-white matter junctions are distinct. Basal cisterns are not effaced. No evidence of acute intracranial hemorrhage. No depressed skull fractures. Visualized paranasal sinuses and mastoid air cells are not opacified.  IMPRESSION: No acute  intracranial abnormalities. Mild atrophy and small vessel ischemic changes similar prior study.   Electronically Signed   By: Burman Nieves M.D.   On: 04/08/2014 05:54      Assessment/Plan:   79 y.o. male with  Principal Problem:   1.    CAP (community acquired pneumonia)   IV Levaquin   Albuterol Nebs   O2 PRN   Monitor O2 Sats   NPO, and Check Swallowing Evaluation per Speech   Active Problems:   2.    Acute delirium- Due to #1 and #3,  ?  Dementia   Monitor O2 sats ,   O2 PRN   Levaquin for CAP    3.    Hypoxemia- due # 1   O2 PRN'   Monitor O2 sats    4.    Hypertension   Monitor BPs       5.    Hyperlipidemia   On Atorvastatin     6.    Bipolar affective disorder   On Venlafaxine, Navane, Wellbutrin XL,      7.    Sleep apnea   Monitor      8.    DVT Prophylaxis   Lovenox    Code Status:   FULL CODE    Family Communication:  No Family Present  Disposition Plan:     Inpatient Med/Surg Bed  Time spent:  60 Minutes  Ron Parker [facility] Pager [phone]   If 7AM -7PM Please Contact the Day Rounding Team MD for [facility]  If 7PM-7AM, Please  Contact Night-Floor Coverage  [email or web address] Password [email or web address] 04/09/2014, 11:36 PM      ADDENDUM: Patient was seen and examined on 04/09/2014

## 2014-04-09 NOTE — ED Notes (Signed)
Pt with increased confusion from yesterday and decreased conversation. Called md to inform stated he would come back and assess pt status.

## 2014-04-09 NOTE — ED Notes (Signed)
md informed that pt having insomnia and some sundowners. md stated to give pt ativan 1 mg. Will continue to monitor.

## 2014-04-10 DIAGNOSIS — R410 Disorientation, unspecified: Secondary | ICD-10-CM

## 2014-04-10 DIAGNOSIS — J189 Pneumonia, unspecified organism: Principal | ICD-10-CM

## 2014-04-10 DIAGNOSIS — J9601 Acute respiratory failure with hypoxia: Secondary | ICD-10-CM

## 2014-04-10 DIAGNOSIS — D72829 Elevated white blood cell count, unspecified: Secondary | ICD-10-CM

## 2014-04-10 LAB — CBC
HCT: 40.3 % (ref 39.0–52.0)
HEMOGLOBIN: 13 g/dL (ref 13.0–17.0)
MCH: 28.8 pg (ref 26.0–34.0)
MCHC: 32.3 g/dL (ref 30.0–36.0)
MCV: 89.4 fL (ref 78.0–100.0)
Platelets: 209 10*3/uL (ref 150–400)
RBC: 4.51 MIL/uL (ref 4.22–5.81)
RDW: 14.7 % (ref 11.5–15.5)
WBC: 8.2 10*3/uL (ref 4.0–10.5)

## 2014-04-10 LAB — BASIC METABOLIC PANEL
Anion gap: 7 (ref 5–15)
BUN: 16 mg/dL (ref 6–23)
CO2: 26 mmol/L (ref 19–32)
CREATININE: 1.15 mg/dL (ref 0.50–1.35)
Calcium: 8.7 mg/dL (ref 8.4–10.5)
Chloride: 109 mEq/L (ref 96–112)
GFR, EST AFRICAN AMERICAN: 69 mL/min — AB (ref >90)
GFR, EST NON AFRICAN AMERICAN: 60 mL/min — AB (ref >90)
Glucose, Bld: 101 mg/dL — ABNORMAL HIGH (ref 70–99)
Potassium: 4 mmol/L (ref 3.5–5.1)
Sodium: 142 mmol/L (ref 135–145)

## 2014-04-10 MED ORDER — CHLORHEXIDINE GLUCONATE 0.12 % MT SOLN
15.0000 mL | Freq: Two times a day (BID) | OROMUCOSAL | Status: DC
  Administered 2014-04-10 – 2014-04-14 (×6): 15 mL via OROMUCOSAL
  Filled 2014-04-10 (×13): qty 15

## 2014-04-10 MED ORDER — IPRATROPIUM-ALBUTEROL 0.5-2.5 (3) MG/3ML IN SOLN
3.0000 mL | Freq: Three times a day (TID) | RESPIRATORY_TRACT | Status: DC
Start: 2014-04-10 — End: 2014-04-15
  Administered 2014-04-10 – 2014-04-15 (×16): 3 mL via RESPIRATORY_TRACT
  Filled 2014-04-10 (×16): qty 3

## 2014-04-10 MED ORDER — CETYLPYRIDINIUM CHLORIDE 0.05 % MT LIQD
7.0000 mL | Freq: Two times a day (BID) | OROMUCOSAL | Status: DC
  Administered 2014-04-15: 7 mL via OROMUCOSAL

## 2014-04-10 MED ORDER — ENOXAPARIN SODIUM 40 MG/0.4ML ~~LOC~~ SOLN
40.0000 mg | Freq: Every day | SUBCUTANEOUS | Status: DC
  Administered 2014-04-10 – 2014-04-14 (×5): 40 mg via SUBCUTANEOUS
  Filled 2014-04-10 (×6): qty 0.4

## 2014-04-10 NOTE — Progress Notes (Signed)
Patient with sleep apnea. No orders for cpap-do not know if even uses one at home. On continuous pulse ox with sats at time diving into mid to upper 80's for a few seconds. On oxygen at 3l [place] but patient mouth breaths. Will continue to monitor.

## 2014-04-10 NOTE — Evaluation (Signed)
Clinical/Bedside Swallow Evaluation Patient Details  Name: Jeffrey Nguyen MRN: [id number] Date of Birth: March 25, 1936  Today's Date: 04/10/2014 Time: 1229-1243 SLP Time Calculation (min) (ACUTE ONLY): 14 min  Past Medical History:  Past Medical History  Diagnosis Date  . Bipolar affective disorder   . Depression   . Hypertension   . Hyperlipidemia   . Sleep apnea   . BPH (benign prostatic hyperplasia)   . Hypoxemia   . Pneumonia   . AAA (abdominal aortic aneurysm) 10/13   Past Surgical History: History reviewed. No pertinent past surgical history. HPI:  79 y.o. male with a history of HTN, Hyperlipidemia, Bipolar Disorder, OSA , AAA [facility] due to increased confusion. He was being further evaluated by [facility]/Psych and began to have increased lethargy. Chest X-ray revealed increased bibasilar airspace opacity, especially at the left lung base, compatible with atelectasis or pneumonia.   Assessment / Plan / Recommendation Clinical Impression  Pt required max verbal and tactile assist during self feeding due to confusion. Exhibited difficulty consistently expressing thin via straw due to decreased cognitive status. No indications of laryngeal penetration or aspiration. Functional oral prep, mastication and transit with solid texture.  Recommend regular texture and thin liquids, straws allowed, pills with liquid, assist during meals. No further ST needed at this time.      Aspiration Risk  Mild    Diet Recommendation Regular;Thin liquid   Liquid Administration via: Cup;Straw Medication Administration: Whole meds with liquid Supervision: Patient able to self feed;Full supervision/cueing for compensatory strategies Compensations: Slow rate;Small sips/bites Postural Changes and/or Swallow Maneuvers: Seated upright 90 degrees    Other  Recommendations Oral Care Recommendations: Oral care BID   Follow Up Recommendations  None    Frequency and Duration         Pertinent Vitals/Pain No evidence pain         Swallow Study          Oral/Motor/Sensory Function Overall Oral Motor/Sensory Function: Appears within functional limits for tasks assessed   Ice Chips Ice chips: Not tested   Thin Liquid Thin Liquid: Within functional limits Presentation: Cup;Straw    Nectar Thick Nectar Thick Liquid: Not tested   Honey Thick Honey Thick Liquid: Not tested   Puree Puree: Within functional limits   Solid   GO    Solid: Within functional limits       Royce MacadamiaLitaker, Johara Lodwick Willis 04/10/2014,2:08 PM Breck CoonsLisa Willis Lonell FaceLitaker M.Ed [employer] Pager [phone]

## 2014-04-10 NOTE — Progress Notes (Signed)
Patient admitted from ED with PNA to room 1334. Patient lethargic but arousable . Acknowledges name. Speech mumbling and difficult to understand. Safety sitter at bedside and bed alarm on.

## 2014-04-10 NOTE — Progress Notes (Signed)
Clinical Social Work Department CLINICAL SOCIAL WORK PSYCHIATRY SERVICE LINE ASSESSMENT 04/10/2014  Patient:  Jeffrey Nguyen  Account:  [id number]  [place] Date:  04/08/2014  Clinical Social Worker:  Sindy Messing, LCSW  Date/Time:  04/10/2014 01:30 PM Referred by:  Physician  Date referred:  04/10/2014 Reason for Referral  Psychosocial assessment   Presenting Symptoms/Problems (In the person's/family's own words):   Psych consulted due to confusion.   Abuse/Neglect/Trauma History (check all that apply)  Denies history   Abuse/Neglect/Trauma Comments:   Psychiatric History (check all that apply)  Outpatient treatment  Inpatient/hospitilization   Psychiatric medications:  Cogentin 1 mg  Wellbutrin 150 mg  Haldol Decanoate 100 mg  Ativan 1 mg  Effexor 150 mg   Current Mental Health Hospitalizations/Previous Mental Health History:   Patient has been diagnosed with bipolar and paranoid schizophrenia. Patient has been receiving medication management for several years.   Current provider:   Dr. Jari Favre and Date:   [facility]   Current Medications:   Scheduled Meds:      . antiseptic oral rinse  7 mL Mouth Rinse q12n4p  . chlorhexidine  15 mL Mouth Rinse BID  . enoxaparin (LOVENOX) injection  40 mg Subcutaneous QHS  . ipratropium-albuterol  3 mL Nebulization TID  . levofloxacin (LEVAQUIN) IV  500 mg Intravenous Q24H        Continuous Infusions:      . sodium chloride 1,000 mL (04/10/14 0805)          PRN Meds:.acetaminophen **OR** acetaminophen, diphenhydrAMINE, haloperidol lactate, HYDROmorphone (DILAUDID) injection, LORazepam, ondansetron **OR** ondansetron (ZOFRAN) IV       Previous Impatient Admission/Date/Reason:   Patient has been to [facility] several years ago. Patient went to [place] [facility] when it was [facility] and went to [place] [facility] about 4-5 years ago.   Emotional Health / Current Symptoms    Suicide/Self Harm  None reported   Suicide attempt in the  past:   GF reports no previous suicide attempts and has never voiced any SI or HI.   Other harmful behavior:   None reported   Psychotic/Dissociative Symptoms  Visual Hallucinations   Other Psychotic/Dissociative Symptoms:   Sitter at bedside reports that patient has been reaching for objects in the room that are not present.    Attention/Behavioral Symptoms  Unable to accurately assess   Other Attention / Behavioral Symptoms:   Patient disengaged and confused and unable to fully participate in assessment.    Cognitive Impairment  Unable to accurately assess   Other Cognitive Impairment:   Patient disoriented at this time.    Mood and Adjustment  Unable to accurately assess    Stress, Anxiety, Trauma, Any Recent Loss/Stressor  None reported   Anxiety (frequency):   N/A   Phobia (specify):   N/A   Compulsive behavior (specify):   N/A   Obsessive behavior (specify):   N/A   Other:   N/A   Substance Abuse/Use  History of substance use   SBIRT completed (please refer for detailed history):  N  Self-reported substance use:   GF reports patient used to be a heavy drinker but stopped drinking about 4 years ago. Patient never used any illegal substances.   Urinary Drug Screen Completed:  Y Alcohol level:   <5    Environmental/Housing/Living Arrangement  Stable housing   Who is in the home:   GF   Emergency contact:  Paediatric nurse  [employer]  Patient's Strengths and Goals (patient's own words):   Patient has supportive GF. Patient is compliant with outpatient follow up and taking his medications.   Clinical Social Worker's Interpretive Summary:   CSW received referral in order to complete psychosocial assessment. CSW reviewed chart and met with patient at bedside. Patient has sitter present and confused. CSW tried to engage patient in assessment but patient confused and unable to fully participate in assessment.    CSW  called and spoke with patient's GF [name]). GF reports she and patient have been together for over 25 years but have never gotten married and do not have children together. GF able to provide background information on patient and reports patient worked at [employer] until about 10 years ago. Patient was laid off and then decided to retire. Patient was diagnosed with bipolar and paranoid schizophrenia several years ago and has followed with Dr. Reece Levy for about 20 years. GF manages medications and has to supervise patient when taking medications or he tries to skip doses.    GF reports patient has been acting odd and on Monday night she called the police because patient was confused and had fallen. [employer] arrived and patient voluntary agreed to go to the hospital. GF reports patient has become "lazy and sluggish" over the past few months and usually lays in bed all day and watches TV. Patient was going to be tested for possible dementia as well.    Patient has been to inpatient psych facilities in the past for hallucinations or "mental breakdowns" but never due to suicide attempts that GF is aware of. GF reports she knows patient was IVC and recommending inpatient psych placement and wants him close to home so she can visit. GF reports if patient improves and does not warrant psych placement then she would be interested in SNF placement at [facility].    IVC paperwork placed in chart and valid until 04/19/14. CSW will continue to follow.   Disposition:  Recommend Psych CSW continuing to support while in hospital   [place], [place] [phone]

## 2014-04-10 NOTE — Progress Notes (Addendum)
[name], male   DOB: April 11, 1935, 79 y.o.   MRN: [id number] [facility] PROGRESS NOTE  Jeffrey Nguyen:[id number] DOB: 1935-10-05 DOA: 04/08/2014 PCP: [name], MD  Brief narrative:    79 y.o. male with a history of HTN, Hyperlipidemia, Bipolar Disorder, OSA who presented to [facility] ED with increased confusion, suicidal ideations. He was further evaluated by psychiatry in ED but because he was lethargic and his chest x-ray showed possible pneumonia he was admitted for management of pneumonia. He will have to be evaluated by psychiatry prior to discharge.  Assessment/Plan:    Principal Problem: Acute respiratory failure with hypoxia / community-acquired pneumonia / leukocytosis  Chest x-ray on the admission showed increased bibasilar airspace opacity especially at the left lung base compatible with atelectasis or pneumonia.   CT chest on the admission showed no acute findings but there was focal opacity in the right lower lobe at the posterior lung base measuring 11 mm, most likely atelectasis or scarring but neoplastic disease may be possible. We will likely repeat CT chest in the next couple of days to monitor for resolution of pneumonia.  Continue current management with Levaquin.  Follow-up blood culture results.  Active Problems: Acute encephalopathy / acute delirium / bipolar disorder  Acute encephalopathy likely secondary to multiple issues, primarily psychiatric issues.  Patient has sitter at the bedside.  He will need a psychiatry evaluation once able to participate in conversation   CT head did not show acute intracranial findings.    DVT Prophylaxis   Lovenox subcutaneous ordered  Code Status: Full.  Family Communication:  Family not at the bedside Disposition Plan: IVC, remains inpatient  IV access:  Peripheral IV  Procedures and diagnostic studies:    Ct Abdomen Pelvis Wo Contrast 04/07/2014 1. No acute findings. 2. Focal opacity in the  right lower lobe at the posterior lung base measuring 11 mm. Although this is most likely atelectasis and/or scarring, neoplastic disease is possible. Recommend either followup PET-CT or short term CT follow-up with repeat unenhanced chest CT in 3 months. 3. No other evidence of neoplastic disease. 4. Bladder wall thickening which may be due to chronic bladder outlet obstruction, although the prostate gland is not appear overtly enlarged. 5. Scattered sigmoid colon diverticula. No diverticulitis or bowel inflammation. Cause of the patient's diarrhea is unclear.     Dg Chest 2 View 04/09/2014    1. Increased bibasilar airspace opacity, especially at the left lung base, compatible with atelectasis or pneumonia. 2. Thoracic spondylosis.     Ct Head Wo Contrast 04/08/2014   No acute intracranial abnormalities. Mild atrophy and small vessel ischemic changes similar prior study.     Ct Chest Wo Contrast 04/07/2014 1. No acute findings. 2. Focal opacity in the right lower lobe at the posterior lung base measuring 11 mm. Although this is most likely atelectasis and/or scarring, neoplastic disease is possible. Recommend either followup PET-CT or short term CT follow-up with repeat unenhanced chest CT in 3 months. 3. No other evidence of neoplastic disease. 4. Bladder wall thickening which may be due to chronic bladder outlet obstruction, although the prostate gland is not appear overtly enlarged. 5. Scattered sigmoid colon diverticula. No diverticulitis or bowel inflammation. Cause of the patient's diarrhea is unclear.      Medical Consultants:  None   Other Consultants:  None   IAnti-Infectives:   Levaquin 04/09/2014 -->   Manson Passey, MD  [facility] Pager [phone]  If 7PM-7AM, please contact  night-coverage [email or web address] Password [email or web address] 04/10/2014, 11:54 AM   LOS: 2 days    HPI/Subjective: No acute overnight events.  Objective: Filed Vitals:   04/10/14 0409 04/10/14 0447 04/10/14 0632  04/10/14 0911  BP:  118/69    Pulse: 84 89 88   Temp:  98 F (36.7 C)    TempSrc:  Oral    Resp:  16    Weight:      SpO2: 92% 98% 93% 95%    Intake/Output Summary (Last 24 hours) at 04/10/14 1154 Last data filed at 04/10/14 1102  Gross per 24 hour  Intake    694 ml  Output   1350 ml  Net   -656 ml    Exam:   General:  Pt is not in acute distress  Cardiovascular: Regular rate and rhythm, S1/S2 (+)  Respiratory: no wheezing, no crackles, no rhonchi  Abdomen: Soft, non tender, non distended, bowel sounds present  Extremities: No edema, pulses DP and PT palpable bilaterally  Neuro: Grossly nonfocal  Data Reviewed: Basic Metabolic Panel:  Recent Labs Lab 04/08/14 0401 04/09/14 2054 04/10/14 0535  NA 137 140 142  K 4.2 4.1 4.0  CL 104 107 109  CO2 21 26 26   GLUCOSE 123* 99 101*  BUN 28* 18 16  CREATININE 1.88* 1.26 1.15  CALCIUM 9.0 8.8 8.7   Liver Function Tests:  Recent Labs Lab 04/08/14 0401  AST 24  ALT 20  ALKPHOS 84  BILITOT 0.7  PROT 6.8  ALBUMIN 4.1   No results for input(s): LIPASE, AMYLASE in the last 168 hours. No results for input(s): AMMONIA in the last 168 hours. CBC:  Recent Labs Lab 04/08/14 0401 04/09/14 2054 04/10/14 0535  WBC 10.6* 6.8 8.2  NEUTROABS  --  3.9  --   HGB 15.2 12.7* 13.0  HCT 45.5 39.6 40.3  MCV 87.2 88.4 89.4  PLT 243 221 209   Cardiac Enzymes: No results for input(s): CKTOTAL, CKMB, CKMBINDEX, TROPONINI in the last 168 hours. BNP: Invalid input(s): POCBNP CBG: No results for input(s): GLUCAP in the last 168 hours.  No results found for this or any previous visit (from the past 240 hour(s)).   Scheduled Meds: . antiseptic oral rinse  7 mL Mouth Rinse q12n4p  . chlorhexidine  15 mL Mouth Rinse BID  . enoxaparin (LOVENOX) injection  40 mg Subcutaneous QHS  . ipratropium-albuterol  3 mL Nebulization TID  . levofloxacin (LEVAQUIN) IV  500 mg Intravenous Q24H   Continuous Infusions: . sodium  chloride 1,000 mL (04/10/14 0805)

## 2014-04-11 DIAGNOSIS — I10 Essential (primary) hypertension: Secondary | ICD-10-CM

## 2014-04-11 DIAGNOSIS — G9340 Encephalopathy, unspecified: Secondary | ICD-10-CM

## 2014-04-11 MED ORDER — SODIUM CHLORIDE 0.9 % IV SOLN
INTRAVENOUS | Status: AC
  Administered 2014-04-12: 06:00:00 via INTRAVENOUS

## 2014-04-11 MED ORDER — AMLODIPINE BESYLATE 5 MG PO TABS
5.0000 mg | ORAL_TABLET | Freq: Every day | ORAL | Status: DC
  Administered 2014-04-11 – 2014-04-14 (×4): 5 mg via ORAL
  Filled 2014-04-11 (×4): qty 1

## 2014-04-11 NOTE — Progress Notes (Addendum)
[name], male   DOB: 1935-12-08, 79 y.o.   MRN: [id number] [facility] PROGRESS NOTE  Jeffrey Nguyen WUJ:[id number] DOB: 1935-12-08 DOA: 04/08/2014 PCP: Darrow BussingKOIRALA,DIBAS, MD  Brief narrative:    79 y.o. male with a history of HTN, Hyperlipidemia, Bipolar Disorder, OSA who [facility] with increased confusion, suicidal ideations. He was further evaluated by psychiatry in ED but because he was lethargic and his chest x-ray showed possible pneumonia he was admitted for management of pneumonia. He will have to be evaluated by psychiatry prior to discharge.  Assessment/Plan:     Principal Problem: Acute respiratory failure with hypoxia / community-acquired pneumonia / leukocytosis  Chest x-ray on the admission showed increased bibasilar airspace opacity especially at the left lung base compatible with atelectasis or pneumonia.   CT chest on the admission showed no acute findings but there was focal opacity in the right lower lobe at the posterior lung base measuring 11 mm, most likely atelectasis or scarring but neoplastic disease may be possible. We will likely repeat CT chest in the next couple of days to monitor for resolution of pneumonia.  Continue Levaquin.  Blood cultures to date are negative  Active Problems: Acute encephalopathy / acute delirium / bipolar disorder  Acute encephalopathy likely secondary to multiple issues, primarily psychiatric issues. CT head did not show acute intracranial findings.  No sitter at the bedside.  Psychiatry consulted for capacity.  Essential hypertension  Added Norvasc 5 mg daily  DVT Prophylaxis   Lovenox subcutaneous ordered  Code Status: Full.  Family Communication:  Family not at the bedside Disposition Plan: IVC, remains inpatient  IV access:  Peripheral IV  Procedures and diagnostic studies:    Ct Abdomen Pelvis Wo Contrast 04/07/2014 1. No acute findings. 2. Focal opacity in the right lower lobe at the  posterior lung base measuring 11 mm. Although this is most likely atelectasis and/or scarring, neoplastic disease is possible. Recommend either followup PET-CT or short term CT follow-up with repeat unenhanced chest CT in 3 months. 3. No other evidence of neoplastic disease. 4. Bladder wall thickening which may be due to chronic bladder outlet obstruction, although the prostate gland is not appear overtly enlarged. 5. Scattered sigmoid colon diverticula. No diverticulitis or bowel inflammation. Cause of the patient's diarrhea is unclear.     Dg Chest 2 View 04/09/2014    1. Increased bibasilar airspace opacity, especially at the left lung base, compatible with atelectasis or pneumonia. 2. Thoracic spondylosis.     Ct Head Wo Contrast 04/08/2014   No acute intracranial abnormalities. Mild atrophy and small vessel ischemic changes similar prior study.     Ct Chest Wo Contrast 04/07/2014 1. No acute findings. 2. Focal opacity in the right lower lobe at the posterior lung base measuring 11 mm. Although this is most likely atelectasis and/or scarring, neoplastic disease is possible. Recommend either followup PET-CT or short term CT follow-up with repeat unenhanced chest CT in 3 months. 3. No other evidence of neoplastic disease. 4. Bladder wall thickening which may be due to chronic bladder outlet obstruction, although the prostate gland is not appear overtly enlarged. 5. Scattered sigmoid colon diverticula. No diverticulitis or bowel inflammation. Cause of the patient's diarrhea is unclear.      Medical Consultants:  None   Other Consultants:  None   IAnti-Infectives:   Levaquin 04/09/2014 -->   Manson PasseyEVINE, ALMA, MD  [facility] Pager [phone]  If 7PM-7AM, please contact night-coverage [email or web address] Password [email or web address]  04/11/2014, 10:21 AM   LOS: 3 days    HPI/Subjective: No acute overnight events.  Objective: Filed Vitals:   04/10/14 2235 04/11/14 0220 04/11/14 0744 04/11/14 0859  BP:  151/81     Pulse:  108 107   Temp:  97.2 F (36.2 C)    TempSrc:  Oral    Resp:  16 20   Weight:      SpO2: 94% 93% 95% 98%    Intake/Output Summary (Last 24 hours) at 04/11/14 1021 Last data filed at 04/11/14 0551  Gross per 24 hour  Intake 2475.2 ml  Output   1600 ml  Net  875.2 ml    Exam:   General:  Pt is  not in acute distress  Cardiovascular: Regular rate and rhythm, S1/S2(+)  Respiratory: no wheezing, no crackles, no rhonchi  Abdomen: non distended, bowel sounds present  Extremities: pulses DP and PT palpable bilaterally  Neuro: Grossly nonfocal  Data Reviewed: Basic Metabolic Panel:  Recent Labs Lab 04/08/14 0401 04/09/14 2054 04/10/14 0535  NA 137 140 142  K 4.2 4.1 4.0  CL 104 107 109  CO2 GLUCOSE 123* 99 101*  BUN 28* 18 16  CREATININE 1.88* 1.26 1.15  CALCIUM 9.0 8.8 8.7   Liver Function Tests:  Recent Labs Lab 04/08/14 0401  AST 24  ALT 20  ALKPHOS 84  BILITOT 0.7  PROT 6.8  ALBUMIN 4.1   No results for input(s): LIPASE, AMYLASE in the last 168 hours. No results for input(s): AMMONIA in the last 168 hours. CBC:  Recent Labs Lab 04/08/14 0401 04/09/14 2054 04/10/14 0535  WBC 10.6* 6.8 8.2  NEUTROABS  --  3.9  --   HGB 15.2 12.7* 13.0  HCT 45.5 39.6 40.3  MCV 87.2 88.4 89.4  PLT 243 221 209   Cardiac Enzymes: No results for input(s): CKTOTAL, CKMB, CKMBINDEX, TROPONINI in the last 168 hours. BNP: Invalid input(s): POCBNP CBG: No results for input(s): GLUCAP in the last 168 hours.  Recent Results (from the past 240 hour(s))  Blood culture (routine x 2)     Status: None (Preliminary result)   Collection Time: 04/09/14 11:00 PM  Result Value Ref Range Status   Specimen Description BLOOD LEFT FOREARM  Final   Special Requests BOTTLES DRAWN AEROBIC AND ANAEROBIC 3CC  Final   Culture   Final           BLOOD CULTURE RECEIVED NO GROWTH TO DATE     Report Status PENDING  Incomplete  Blood culture (routine x 2)      Status: None (Preliminary result)   Collection Time: 04/09/14 11:00 PM  Result Value Ref Range Status   Specimen Description BLOOD LEFT ARM  Final   Special Requests BOTTLES DRAWN AEROBIC AND ANAEROBIC 5CC  Final   Culture   Final           BLOOD CULTURE RECEIVED NO GROWTH TO DATE  Performed at [facility]    Report Status PENDING  Incomplete     Scheduled Meds: . antiseptic oral rinse  7 mL Mouth Rinse q12n4p  . chlorhexidine  15 mL Mouth Rinse BID  . enoxaparin (LOVENOX) injection  40 mg Subcutaneous QHS  . ipratropium-albuterol  3 mL Nebulization TID  . levofloxacin (LEVAQUIN) IV  500 mg Intravenous Q24H   Continuous Infusions: . sodium chloride 75 mL/hr at 04/10/14 2032

## 2014-04-11 NOTE — Consult Note (Signed)
[facility] Face-to-Face Psychiatry Consult   Reason for Consult: capacity evaluation  Referring Physician:  Dr. Christiana Fuchs is an 79 y.o. male. Total Time spent with patient: 45 minutes  Assessment DSM   Past Medical History  Diagnosis Date  . Bipolar affective disorder   . Depression   . Hypertension   . Hyperlipidemia   . Sleep apnea   . BPH (benign prostatic hyperplasia)   . Hypoxemia   . Pneumonia   . AAA (abdominal aortic aneurysm) 10/13      Plan: Case discussed with Sindy Messing, LCSW and Dr. Charlies Silvers Patient does not meet criteria for capacity to make his own medical decisions and living arrangement May contact psych social service regarding [place] if needed Recommend psychiatric Inpatient admission when medically cleared. Gero-psychiatric unit  Subjective:   Jeffrey Nguyen is a 79 y.o. male patient admitted with Mood disorder, R/O Dementia  HPI:  Jeffrey Nguyen is a 79 years old Caucasian male seen for psychiatric consultation and evaluation regarding capacity evaluation. Patient has been severely demented and also psychotic and unable to understand his current clinical/mental status and unable to provide information required for orientation, concentration, language skills and he does not seem to understand his current medical condition and required treatment needs.  Please see the following information from the emergency psychiatric consultation note; patient is a 79 years old was seen for assessment of mood disorder and possible Dementia.  Please see note by TTS staff and psych ER consult note.  This am, patient was not coherent enough to answer all questions.  He stated that he occasionally hears voices.  Patient stated that he did not know why he was brought to the hospital.  He stated that he live in a home with a male friend and that he does not have children.  His Nurse stated that patient has not been talking to her and that she could not obtain meaningful  information from patient.RN stated that Patient was dropped off at [facility] by a male who left and [facility] staff brought patient to the ER.  We have accepted patient for admission for more detailed evaluation.  Patient will be transferred to Gero-psychiatric facility as soon as bed is available.   Patient qualifies for IVC based on the degree of his illness and his inability to make decision for his treatment and for his safety.  Patient will be transferred when bed is available. Today patient was seen in bed with a sitter monitoring him.  He is not agitated or agressive but he is disoriented and confused.  Patient constantly tries to get up and out of his bed despite his unsteady gait.  Patient could not answer any question asked of him.  He is able to eat and swallow his medications.  His medications were  Changed based on his presentation.  His Haldol decanoate will be held unit further notice.  We will continue to monitor patient while we wait for available bed for admission.  HPI Elements:  Location:  r/o Dementia, mood disorder. Quality:  unable to obtain at this time. Severity:  severe. Timing:  acute. Duration:  unknown. Context:  Brought in by [facility] for evaluation..  Past Psychiatric History: Past Medical History  Diagnosis Date  . Bipolar affective disorder   . Depression   . Hypertension   . Hyperlipidemia   . Sleep apnea   . BPH (benign prostatic hyperplasia)   . Hypoxemia   . Pneumonia   . AAA (  abdominal aortic aneurysm) 10/13    reports that he has quit smoking. He does not have any smokeless tobacco history on file. He reports that he drinks alcohol. He reports that he does not use illicit drugs. Family History  Problem Relation Age of Onset  . Multiple sclerosis Sister    Family History Substance Abuse: No Family Supports: No Living Arrangements: Spouse/significant other Can pt return to current living arrangement?: Yes Abuse/Neglect [facility]) Physical Abuse:  Denies Verbal Abuse: Denies Sexual Abuse: Denies Allergies:  No Known Allergies  ACT Assessment Complete:  Yes:    Educational Status    Risk to Self: Risk to self with the past 6 months Suicidal Ideation: No-Not Currently/Within Last 6 Months Suicidal Intent: No-Not Currently/Within Last 6 Months (reports last attempted in June) Is patient at risk for suicide?: No Suicidal Plan?:  (UTA) Access to Means: No What has been your use of drugs/alcohol within the last 12 months?: None Previous Attempts/Gestures: Yes How many times?: 4 (unclear if this is accurate unable to provide details) Other Self Harm Risks: falls frequently  Triggers for Past Attempts: Unknown Intentional Self Injurious Behavior: Bruising (reports falls,  when asked about hurting self intentionally ) Comment - Self Injurious Behavior: falls frequently reported this when asked if he hurts himself on purpose Family Suicide History: No Recent stressful life event(s): Other (Comment) (denies) Persecutory voices/beliefs?: No Depression: No ("none right now") Depression Symptoms:  (none) Substance abuse history and/or treatment for substance abuse?: No Suicide prevention information given to non-admitted patients: Yes  Risk to Others: Risk to Others within the past 6 months Homicidal Ideation: No Thoughts of Harm to Others: No Current Homicidal Intent: No Current Homicidal Plan: No Access to Homicidal Means: No Identified Victim: none History of harm to others?: No Assessment of Violence: None Noted Violent Behavior Description: none Does patient have access to weapons?: No Criminal Charges Pending?: No Does patient have a court date: No  Abuse: Abuse/Neglect Assessment (Assessment to be complete while patient is alone) Physical Abuse: Denies Verbal Abuse: Denies Sexual Abuse: Denies Exploitation of patient/patient's resources: Denies Self-Neglect: Denies  Prior Inpatient Therapy: Prior Inpatient Therapy Prior  Inpatient Therapy: No Prior Therapy Dates: NA Prior Therapy Facilty/Provider(s): NA Reason for Treatment: NA  Prior Outpatient Therapy: Prior Outpatient Therapy Prior Outpatient Therapy: Yes Prior Therapy Dates: current Prior Therapy Facilty/Provider(s): Dr. Reece Levy Reason for Treatment: medication management  Additional Information: Additional Information 1:1 In Past 12 Months?: No CIRT Risk: Yes Elopement Risk: Yes Does patient have medical clearance?: No   Objective: Blood pressure 151/81, pulse 107, temperature 97.2 F (36.2 C), temperature source Oral, resp. rate 20, height 0' (0 m), weight 88.9 kg (195 lb 15.8 oz), SpO2 98 %.Cannot calculate BMI with a height equal to zero. Results for orders placed or performed during the hospital encounter of 04/08/14 (from the past 72 hour(s))  Basic metabolic panel     Status: Abnormal   Collection Time: 04/09/14  8:54 PM  Result Value Ref Range   Sodium 140 135 - 145 mmol/L    Comment: Please note change in reference range.   Potassium 4.1 3.5 - 5.1 mmol/L    Comment: Please note change in reference range.   Chloride 107 96 - 112 mEq/L   CO2 26 19 - 32 mmol/L   Glucose, Bld 99 70 - 99 mg/dL   BUN 18 6 - 23 mg/dL   Creatinine, Ser 1.26 0.50 - 1.35 mg/dL   Calcium 8.8 8.4 - 10.5 mg/dL  GFR calc non Af Amer 54 (L) >90 mL/min   GFR calc Af Amer 62 (L) >90 mL/min    Comment: (NOTE) The eGFR has been calculated using the CKD EPI equation. This calculation has not been validated in all clinical situations. eGFR's persistently <90 mL/min signify possible Chronic Kidney Disease.    Anion gap 7 5 - 15  CBC with Differential     Status: Abnormal   Collection Time: 04/09/14  8:54 PM  Result Value Ref Range   WBC 6.8 4.0 - 10.5 K/uL   RBC 4.48 4.22 - 5.81 MIL/uL   Hemoglobin 12.7 (L) 13.0 - 17.0 g/dL   HCT 39.6 39.0 - 52.0 %   MCV 88.4 78.0 - 100.0 fL   MCH 28.3 26.0 - 34.0 pg   MCHC 32.1 30.0 - 36.0 g/dL   RDW 14.7 11.5 - 15.5 %    Platelets 221 150 - 400 K/uL   Neutrophils Relative % 56 43 - 77 %   Neutro Abs 3.9 1.7 - 7.7 K/uL   Lymphocytes Relative 23 12 - 46 %   Lymphs Abs 1.5 0.7 - 4.0 K/uL   Monocytes Relative 11 3 - 12 %   Monocytes Absolute 0.8 0.1 - 1.0 K/uL   Eosinophils Relative 9 (H) 0 - 5 %   Eosinophils Absolute 0.6 0.0 - 0.7 K/uL   Basophils Relative 1 0 - 1 %   Basophils Absolute 0.0 0.0 - 0.1 K/uL  Blood culture (routine x 2)     Status: None (Preliminary result)   Collection Time: 04/09/14 11:00 PM  Result Value Ref Range   Specimen Description BLOOD LEFT FOREARM    Special Requests BOTTLES DRAWN AEROBIC AND ANAEROBIC 3CC    Culture             BLOOD CULTURE RECEIVED NO GROWTH TO DATE CULTURE WILL BE HELD FOR 5 DAYS BEFORE ISSUING A FINAL NEGATIVE REPORT Performed at [facility]    Report Status PENDING   Blood culture (routine x 2)     Status: None (Preliminary result)   Collection Time: 04/09/14 11:00 PM  Result Value Ref Range   Specimen Description BLOOD LEFT ARM    Special Requests BOTTLES DRAWN AEROBIC AND ANAEROBIC 5CC    Culture             BLOOD CULTURE RECEIVED NO GROWTH TO DATE CULTURE WILL BE HELD FOR 5 DAYS BEFORE ISSUING A FINAL NEGATIVE REPORT Performed at [facility]    Report Status PENDING   Basic metabolic panel     Status: Abnormal   Collection Time: 04/10/14  5:35 AM  Result Value Ref Range   Sodium 142 135 - 145 mmol/L    Comment: Please note change in reference range.   Potassium 4.0 3.5 - 5.1 mmol/L    Comment: Please note change in reference range.   Chloride 109 96 - 112 mEq/L   CO2 26 19 - 32 mmol/L   Glucose, Bld 101 (H) 70 - 99 mg/dL   BUN 16 6 - 23 mg/dL   Creatinine, Ser 1.15 0.50 - 1.35 mg/dL   Calcium 8.7 8.4 - 10.5 mg/dL   GFR calc non Af Amer 60 (L) >90 mL/min   GFR calc Af Amer 69 (L) >90 mL/min    Comment: (NOTE) The eGFR has been calculated using the CKD EPI equation. This calculation has not been validated in all clinical  situations. eGFR's persistently <90 mL/min signify possible Chronic Kidney  Disease.    Anion gap 7 5 - 15  CBC     Status: None   Collection Time: 04/10/14  5:35 AM  Result Value Ref Range   WBC 8.2 4.0 - 10.5 K/uL   RBC 4.51 4.22 - 5.81 MIL/uL   Hemoglobin 13.0 13.0 - 17.0 g/dL   HCT 40.3 39.0 - 52.0 %   MCV 89.4 78.0 - 100.0 fL   MCH 28.8 26.0 - 34.0 pg   MCHC 32.3 30.0 - 36.0 g/dL   RDW 14.7 11.5 - 15.5 %   Platelets 209 150 - 400 K/uL   Labs are reviewed and are pertinent for UDS is positive for Benzos, Abnormal kidney function rest  Current Facility-Administered Medications  Medication Dose Route Frequency Provider Last Rate Last Dose  . 0.9 %  sodium chloride infusion   Intravenous Continuous Robbie Lis, MD      . acetaminophen (TYLENOL) tablet 650 mg  650 mg Oral Q6H PRN Theressa Millard, MD       Or  . acetaminophen (TYLENOL) suppository 650 mg  650 mg Rectal Q6H PRN Theressa Millard, MD      . amLODipine (NORVASC) tablet 5 mg  5 mg Oral Daily Robbie Lis, MD      . antiseptic oral rinse (CPC / CETYLPYRIDINIUM CHLORIDE 0.05%) solution 7 mL  7 mL Mouth Rinse q12n4p Robbie Lis, MD   7 mL at 04/10/14 1200  . chlorhexidine (PERIDEX) 0.12 % solution 15 mL  15 mL Mouth Rinse BID Robbie Lis, MD   15 mL at 04/10/14 2008  . diphenhydrAMINE (BENADRYL) injection 12.5 mg  12.5 mg Intravenous Q6H PRN Theressa Millard, MD      . enoxaparin (LOVENOX) injection 40 mg  40 mg Subcutaneous QHS Minda Ditto, RPH   40 mg at 04/10/14 2126  . haloperidol lactate (HALDOL) injection 1 mg  1 mg Intravenous Q6H PRN Theressa Millard, MD      . HYDROmorphone (DILAUDID) injection 0.5-1 mg  0.5-1 mg Intravenous Q3H PRN Theressa Millard, MD   1 mg at 04/11/14 0129  . ipratropium-albuterol (DUONEB) 0.5-2.5 (3) MG/3ML nebulizer solution 3 mL  3 mL Nebulization TID Theressa Millard, MD   3 mL at 04/11/14 0859  . levofloxacin (LEVAQUIN) IVPB 500 mg  500 mg Intravenous Q24H Theressa Millard, MD   500 mg at 04/10/14 2126  . LORazepam (ATIVAN) injection 1 mg  1 mg Intravenous Q4H PRN Theressa Millard, MD   1 mg at 04/11/14 0022  . ondansetron (ZOFRAN) tablet 4 mg  4 mg Oral Q6H PRN Theressa Millard, MD       Or  . ondansetron (ZOFRAN) injection 4 mg  4 mg Intravenous Q6H PRN Theressa Millard, MD        Psychiatric Specialty Exam:     Blood pressure 151/81, pulse 107, temperature 97.2 F (36.2 C), temperature source Oral, resp. rate 20, height 0' (0 m), weight 88.9 kg (195 lb 15.8 oz), SpO2 98 %.Cannot calculate BMI with a height equal to zero.  General Appearance: Casual  Eye Contact::  None  Speech:  Garbled and Slow  Volume:  Decreased  Mood:  Depressed  Affect:  Congruent, Depressed and Flat  Thought Process:  unable to ascertain  Orientation:  Other:  unable to ascertain  Thought Content:  unable to obtain  Suicidal Thoughts:  No  Homicidal Thoughts:  No  Memory:  Unable  to ascertain  Judgement:  Other:  unable to ascertain  Insight:  unable to ascertain  Psychomotor Activity:  Normal  Concentration:  Fair  Recall:  unable to ascertain  Fund of Knowledge:unable to ascertain  Language: Fair  Akathisia:  NA  Handed:  Right  AIMS (if indicated):     Assets:  Desire for Improvement  Sleep:      Musculoskeletal: Strength & Muscle Tone: unable to ascertain, lying down in bed Gait & Station: unable to elucidate, seen in bed Patient leans: seen in bed   Treatment Plan Summary: Daily contact with patient to assess and evaluate symptoms and progress in treatment Medication management Seek treatment at a Gero-psychiatric facility  Continue looking for a Gero-psychiatric facility.  Ethelene Closser,JANARDHAHA R.   04/11/2014 10:29 AM

## 2014-04-11 NOTE — Progress Notes (Signed)
Nutrition Brief Note  Patient identified on the Malnutrition Screening Tool (MST) Report  Pt in room, pt with dementia and is difficult to understand. Pt unable to give history at this time. Pt is eating well (80-100%) with assistance. SLP recommends regular diet with thin liquids.   Wt Readings from Last 15 Encounters:  04/09/14 195 lb 15.8 oz (88.9 kg)   Current diet order is regular, patient is consuming approximately 80-100% of meals at this time. Labs and medications reviewed.   No nutrition interventions warranted at this time. If nutrition issues arise, please consult RD.   Jeffrey FrancoLindsey Mose Colaizzi, MS, RD, LDN Pager: [phone] After Hours Pager: [phone]

## 2014-04-11 NOTE — Care Management Note (Signed)
CARE MANAGEMENT NOTE 04/11/2014  Patient:  Jeffrey Nguyen,Jeffrey Nguyen   Account Number:  [id number]  Date Initiated:  04/11/2014  Documentation initiated by:  Sandford CrazeLEMENTS,Mavryk Pino  Subjective/Objective Assessment:   79 yo admitted with CAP     Action/Plan:   From home with girlfriend   Anticipated DC Date:  04/14/2014   Anticipated DC Plan:  [facility]  In-house referral  Clinical Social Worker      DC Planning Services  CM consult      Choice offered to / List presented to:             Status of service:  In process, will continue to follow Medicare Important Message given?  YES (If response is "NO", the following Medicare IM given date fields will be blank) Date Medicare IM given:  04/11/2014 Medicare IM given by:  Sandford CrazeLEMENTS,Lacrisha Bielicki Date Additional Medicare IM given:   Additional Medicare IM given by:    Discharge Disposition:    Per UR Regulation:    If discussed at Long Length of Stay Meetings, dates discussed:    Comments:  04/11/14 Sandford Crazeora Keymon Mcelroy RN,BSN,NCM [phone] Chart reviewed and CM following for DC needs.

## 2014-04-12 DIAGNOSIS — F0390 Unspecified dementia without behavioral disturbance: Secondary | ICD-10-CM

## 2014-04-12 MED ORDER — INFLUENZA VAC SPLIT QUAD 0.5 ML IM SUSY
0.5000 mL | PREFILLED_SYRINGE | INTRAMUSCULAR | Status: DC
Start: 2014-04-13 — End: 2014-04-14
  Filled 2014-04-12 (×2): qty 0.5

## 2014-04-12 MED ORDER — LEVOFLOXACIN 500 MG PO TABS
500.0000 mg | ORAL_TABLET | Freq: Every day | ORAL | Status: DC
  Administered 2014-04-12 – 2014-04-14 (×3): 500 mg via ORAL
  Filled 2014-04-12 (×4): qty 1

## 2014-04-12 NOTE — Progress Notes (Addendum)
[name] A Boozer, male   DOB: [birth year], 79 y.o.   MRN: [id number] [facility] PROGRESS NOTE  Jeffrey Nguyen A Jeffrey Nguyen:[id number] DOB: [birth year] DOA: 04/08/2014 PCP: Darrow BussingKOIRALA,DIBAS, MD  Brief narrative:    79 y.o. male with a history of HTN, Hyperlipidemia, Bipolar Disorder, OSA who [facility] with increased confusion, suicidal ideations. He was further evaluated by psychiatry in ED but because he was lethargic and his chest x-ray showed possible pneumonia he was admitted for management of pneumonia. He will have to be evaluated by psychiatry prior to discharge.  Assessment/Plan:     Principal Problem: Acute respiratory failure with hypoxia / community-acquired pneumonia / leukocytosis  Chest x-ray on the admission showed increased bibasilar airspace opacity especially at the left lung base compatible with atelectasis or pneumonia.   CT chest on the admission showed no acute findings but there was focal opacity in the right lower lobe at the posterior lung base measuring 11 mm, most likely atelectasis or scarring but neoplastic disease may be possible. We will likely repeat CT chest in the next couple of days to monitor for resolution of pneumonia.  Continue Levaquin for now.  Blood cultures to date are negative  Active Problems: Acute encephalopathy / acute delirium / bipolar disorder  Acute encephalopathy likely secondary to multiple issues, primarily psychiatric issues. CT head did not show acute intracranial findings.  No sitter at the bedside.  Psychiatry consulted for capacity.  Essential hypertension  Continue Norvasc 5 mg daily  DVT Prophylaxis   Lovenox subcutaneous ordered  Code Status: Full.  Family Communication:  Family not at the bedside Disposition Plan: IVC, remains inpatient  IV access:  Peripheral IV  Procedures and diagnostic studies:    Ct Abdomen Pelvis Wo Contrast 04/07/2014 1. No acute findings. 2. Focal opacity in the right lower  lobe at the posterior lung base measuring 11 mm. Although this is most likely atelectasis and/or scarring, neoplastic disease is possible. Recommend either followup PET-CT or short term CT follow-up with repeat unenhanced chest CT in 3 months. 3. No other evidence of neoplastic disease. 4. Bladder wall thickening which may be due to chronic bladder outlet obstruction, although the prostate gland is not appear overtly enlarged. 5. Scattered sigmoid colon diverticula. No diverticulitis or bowel inflammation. Cause of the patient's diarrhea is unclear.     Dg Chest 2 View 04/09/2014    1. Increased bibasilar airspace opacity, especially at the left lung base, compatible with atelectasis or pneumonia. 2. Thoracic spondylosis.     Ct Head Wo Contrast 04/08/2014   No acute intracranial abnormalities. Mild atrophy and small vessel ischemic changes similar prior study.     Ct Chest Wo Contrast 04/07/2014 1. No acute findings. 2. Focal opacity in the right lower lobe at the posterior lung base measuring 11 mm. Although this is most likely atelectasis and/or scarring, neoplastic disease is possible. Recommend either followup PET-CT or short term CT follow-up with repeat unenhanced chest CT in 3 months. 3. No other evidence of neoplastic disease. 4. Bladder wall thickening which may be due to chronic bladder outlet obstruction, although the prostate gland is not appear overtly enlarged. 5. Scattered sigmoid colon diverticula. No diverticulitis or bowel inflammation. Cause of the patient's diarrhea is unclear.      Medical Consultants:  None   Other Consultants:  None   IAnti-Infectives:   Levaquin 04/09/2014 -->    Manson PasseyEVINE, ALMA, MD  [facility] Pager [phone]  If 7PM-7AM, please contact night-coverage  [email or web address] Password [email or web address] 04/12/2014, 10:36 AM   LOS: 4 days    HPI/Subjective: No acute overnight events.  Objective: Filed Vitals:   04/12/14 0220 04/12/14 0635 04/12/14 0845 04/12/14 0942   BP: 134/73 158/78    Pulse: 99 98    Temp: 98.3 F (36.8 C) 98 F (36.7 C)    TempSrc: Oral Oral    Resp: 16 16    Height:     (1.778 m)  Weight:      SpO2: 100% 100% 99%     Intake/Output Summary (Last 24 hours) at 04/12/14 1036 Last data filed at 04/12/14 0840  Gross per 24 hour  Intake    720 ml  Output    650 ml  Net     70 ml    Exam:   General:  Pt is not in acute distress  Cardiovascular: Regular rate and rhythm, S1/S2 (+)  Respiratory: no wheezing, no crackles, no rhonchi  Abdomen: Soft, non tender, non distended, bowel sounds present  Extremities: No edema, pulses DP and PT palpable bilaterally  Neuro: Grossly nonfocal  Data Reviewed: Basic Metabolic Panel:  Recent Labs Lab 04/08/14 0401 04/09/14 2054 04/10/14 0535  NA 137 140 142  K 4.2 4.1 4.0  CL 104 107 109  CO2 GLUCOSE 123* 99 101*  BUN 28* 18 16  CREATININE 1.88* 1.26 1.15  CALCIUM 9.0 8.8 8.7   Liver Function Tests:  Recent Labs Lab 04/08/14 0401  AST 24  ALT 20  ALKPHOS 84  BILITOT 0.7  PROT 6.8  ALBUMIN 4.1   No results for input(s): LIPASE, AMYLASE in the last 168 hours. No results for input(s): AMMONIA in the last 168 hours. CBC:  Recent Labs Lab 04/08/14 0401 04/09/14 2054 04/10/14 0535  WBC 10.6* 6.8 8.2  NEUTROABS  --  3.9  --   HGB 15.2 12.7* 13.0  HCT 45.5 39.6 40.3  MCV 87.2 88.4 89.4  PLT 243 221 209   Cardiac Enzymes: No results for input(s): CKTOTAL, CKMB, CKMBINDEX, TROPONINI in the last 168 hours. BNP: Invalid input(s): POCBNP CBG: No results for input(s): GLUCAP in the last 168 hours.  Blood culture (routine x 2)     Status: None (Preliminary result)   Collection Time: 04/09/14 11:00 PM  Result Value Ref Range Status   Specimen Description BLOOD LEFT FOREARM  Final   Special Requests BOTTLES DRAWN AEROBIC AND ANAEROBIC 3CC  Final   Culture   Final           BLOOD CULTURE RECEIVED NO GROWTH TO DATE    Report Status PENDING   Incomplete  Blood culture (routine x 2)     Status: None (Preliminary result)   Collection Time: 04/09/14 11:00 PM  Result Value Ref Range Status   Specimen Description BLOOD LEFT ARM  Final   Special Requests BOTTLES DRAWN AEROBIC AND ANAEROBIC 5CC  Final   Culture   Final           BLOOD CULTURE RECEIVED NO GROWTH TO DATE     Report Status PENDING  Incomplete     Scheduled Meds: . amLODipine  5 mg Oral Daily  . enoxaparin (LOVENOX) injection  40 mg Subcutaneous QHS  . ipratropium-albuterol  3 mL Nebulization TID  . levofloxacin (LEVAQUIN) IV  500 mg Intravenous Q24H

## 2014-04-12 NOTE — Plan of Care (Signed)
Problem: Phase I Progression Outcomes Goal: Initial discharge plan identified Outcome: Completed/Met Date Met:  04/12/14 Plan for discharge to geri-psych when medically stable.

## 2014-04-12 NOTE — Progress Notes (Signed)
Key Points: Use following P&T approved IV to PO non-antibiotic change policy.  Description contains the criteria that are approved Note: Policy Excludes:  Esophagectomy patientsPHARMACIST - PHYSICIAN COMMUNICATION DR:   Elisabeth Pigeonevine CONCERNING: IV to Oral Route Change Policy  RECOMMENDATION: This patient is receiving Levaquin by the intravenous route.  Based on criteria approved by the Pharmacy and Therapeutics Committee, the intravenous medication(s) is/are being converted to the equivalent oral dose form(s).   DESCRIPTION: These criteria include:  The patient is eating (either orally or via tube) and/or has been taking other orally administered medications for a least 24 hours  The patient has no evidence of active gastrointestinal bleeding or impaired GI absorption (gastrectomy, short bowel, patient on TNA or NPO).  If you have questions about this conversion, please contact the Pharmacy Department  []   [phone]( [phone] )  [facility] []   [phone]( [phone] )  [facility]  []   [phone]( [phone] )  [facility] [x]   [phone]( [phone] )  [facility]  Earl ManyLegge, Harout Scheurich [place], [facility] Community HospitalRPH 04/12/2014 1:35 PM

## 2014-04-13 NOTE — Progress Notes (Addendum)
[name], male   DOB: 05-22-1935, 79 y.o.   MRN: [id number] [facility] PROGRESS NOTE  Jeffrey Nguyen [id number]:[id number] DOB: 04-17-1935 DOA: 04/08/2014 PCP: [name], MD  Brief narrative:    79 y.o. male with a history of HTN, Hyperlipidemia, Bipolar Disorder, OSA who presented to [facility] ED with increased confusion, suicidal ideations. He was further evaluated by psychiatry in ED but because he was lethargic and his chest x-ray showed possible pneumonia he was admitted for management of pneumonia. Per psych pt has no capacity to medical treatment decisions.   Assessment/Plan:     Principal Problem: Acute respiratory failure with hypoxia / community-acquired pneumonia / leukocytosis  Chest x-ray on the admission showed increased bibasilar airspace opacity especially at the left lung base compatible with atelectasis or pneumonia.   CT chest on the admission showed no acute findings but there was focal opacity in the right lower lobe at the posterior lung base measuring 11 mm, most likely atelectasis or scarring but neoplastic disease may be possible. We will likely repeat CT chest in the next couple of days to monitor for resolution of pneumonia.  Continue Levaquin - may switch to PO  Blood cultures to date are negative  Active Problems: Acute encephalopathy / acute delirium / bipolar disorder  Acute encephalopathy likely secondary to multiple issues, primarily psychiatric issues. CT head did not show acute intracranial findings.  No sitter at the bedside.  Psychiatry consulted - no capacity for medical treatment decisions.   Essential hypertension  Continue Norvasc 5 mg daily  DVT Prophylaxis   Lovenox subcutaneous ordered  Code Status: Full.  Family Communication:  Family not at the bedside Disposition Plan: IVC, remains inpatient  IV access:  Peripheral IV  Procedures and diagnostic studies:    Ct Abdomen Pelvis Wo Contrast 04/07/2014 1. No acute  findings. 2. Focal opacity in the right lower lobe at the posterior lung base measuring 11 mm. Although this is most likely atelectasis and/or scarring, neoplastic disease is possible. Recommend either followup PET-CT or short term CT follow-up with repeat unenhanced chest CT in 3 months. 3. No other evidence of neoplastic disease. 4. Bladder wall thickening which may be due to chronic bladder outlet obstruction, although the prostate gland is not appear overtly enlarged. 5. Scattered sigmoid colon diverticula. No diverticulitis or bowel inflammation. Cause of the patient's diarrhea is unclear.     Dg Chest 2 View 04/09/2014    1. Increased bibasilar airspace opacity, especially at the left lung base, compatible with atelectasis or pneumonia. 2. Thoracic spondylosis.     Ct Head Wo Contrast 04/08/2014   No acute intracranial abnormalities. Mild atrophy and small vessel ischemic changes similar prior study.     Ct Chest Wo Contrast 04/07/2014 1. No acute findings. 2. Focal opacity in the right lower lobe at the posterior lung base measuring 11 mm. Although this is most likely atelectasis and/or scarring, neoplastic disease is possible. Recommend either followup PET-CT or short term CT follow-up with repeat unenhanced chest CT in 3 months. 3. No other evidence of neoplastic disease. 4. Bladder wall thickening which may be due to chronic bladder outlet obstruction, although the prostate gland is not appear overtly enlarged. 5. Scattered sigmoid colon diverticula. No diverticulitis or bowel inflammation. Cause of the patient's diarrhea is unclear.      Medical Consultants:  None   Other Consultants:  None   IAnti-Infectives:   Levaquin 04/09/2014 -->   Jeffrey Ojo, MD  [facility]  Pager [phone]  If 7PM-7AM, please contact night-coverage [email or web address] Password [email or web address] 04/13/2014, 12:05 PM   LOS: 5 days    HPI/Subjective: No acute overnight events.  Objective: Filed Vitals:   04/13/14 0157  04/13/14 0607 04/13/14 0845 04/13/14 1000  BP: 130/82 127/57  144/91  Pulse: 100 91  107  Temp: 98.9 F (37.2 C) 98.2 F (36.8 C)  97.7 F (36.5 C)  TempSrc: Axillary Axillary  Oral  Resp: 20 20  20   Height:      Weight:      SpO2: 100% 98% 96% 92%    Intake/Output Summary (Last 24 hours) at 04/13/14 1205 Last data filed at 04/13/14 0610  Gross per 24 hour  Intake    480 ml  Output   1700 ml  Net  -1220 ml    Exam:   General:  Pt is alert, follows commands appropriately, not in acute distress  Cardiovascular: Regular rate and rhythm, S1/S2, no murmurs  Respiratory: Clear to auscultation bilaterally, no wheezing, no crackles, no rhonchi  Abdomen: Soft, non tender, non distended, bowel sounds present  Extremities: No edema, pulses DP and PT palpable bilaterally  Neuro: Grossly nonfocal  Data Reviewed: Basic Metabolic Panel:  Recent Labs Lab 04/08/14 0401 04/09/14 2054 04/10/14 0535  NA 137 140 142  K 4.2 4.1 4.0  CL 104 107 109  CO2 21 26 26   GLUCOSE 123* 99 101*  BUN 28* 18 16  CREATININE 1.88* 1.26 1.15  CALCIUM 9.0 8.8 8.7   Liver Function Tests:  Recent Labs Lab 04/08/14 0401  AST 24  ALT 20  ALKPHOS 84  BILITOT 0.7  PROT 6.8  ALBUMIN 4.1   No results for input(s): LIPASE, AMYLASE in the last 168 hours. No results for input(s): AMMONIA in the last 168 hours. CBC:  Recent Labs Lab 04/08/14 0401 04/09/14 2054 04/10/14 0535  WBC 10.6* 6.8 8.2  NEUTROABS  --  3.9  --   HGB 15.2 12.7* 13.0  HCT 45.5 39.6 40.3  MCV 87.2 88.4 89.4  PLT 243 221 209   Cardiac Enzymes: No results for input(s): CKTOTAL, CKMB, CKMBINDEX, TROPONINI in the last 168 hours. BNP: Invalid input(s): POCBNP CBG: No results for input(s): GLUCAP in the last 168 hours.  Recent Results (from the past 240 hour(s))  Blood culture (routine x 2)     Status: None (Preliminary result)   Collection Time: 04/09/14 11:00 PM  Result Value Ref Range Status   Specimen  Description BLOOD LEFT FOREARM  Final   Special Requests BOTTLES DRAWN AEROBIC AND ANAEROBIC 3CC  Final   Culture   Final           BLOOD CULTURE RECEIVED NO GROWTH TO DATE CULTURE WILL BE HELD FOR 5 DAYS BEFORE ISSUING A FINAL NEGATIVE REPORT [facility]formed at [facility]    Report Status PENDING  Incomplete  Blood culture (routine x 2)     Status: None (Preliminary result)   Collection Time: 04/09/14 11:00 PM  Result Value Ref Range Status   Specimen Description BLOOD LEFT ARM  Final   Special Requests BOTTLES DRAWN AEROBIC AND ANAEROBIC 5CC  Final   Culture   Final           BLOOD CULTURE RECEIVED NO GROWTH TO DATE CULTURE WILL BE HELD FOR 5 DAYS BEFORE ISSUING A FINAL NEGATIVE REPORT Performed at [facility]    Report Status PENDING  Incomplete     Scheduled Meds: . amLODipine  5  mg Oral Daily  . antiseptic oral rinse  7 mL Mouth Rinse q12n4p  . chlorhexidine  15 mL Mouth Rinse BID  . enoxaparin (LOVENOX) injection  40 mg Subcutaneous QHS  . Influenza vac split quadrivalent PF  0.5 mL Intramuscular Tomorrow-1000  . ipratropium-albuterol  3 mL Nebulization TID  . levofloxacin  500 mg Oral QHS   Continuous Infusions:

## 2014-04-14 MED ORDER — LORAZEPAM 1 MG PO TABS: 1.0000 mg | ORAL_TABLET | Freq: Two times a day (BID) | ORAL | Status: AC | PRN

## 2014-04-14 MED ORDER — AMLODIPINE BESYLATE 10 MG PO TABS: 10.0000 mg | ORAL_TABLET | Freq: Every day | ORAL | Status: AC

## 2014-04-14 MED ORDER — AMLODIPINE BESYLATE 10 MG PO TABS
10.0000 mg | ORAL_TABLET | Freq: Every day | ORAL | Status: DC
  Administered 2014-04-15: 10 mg via ORAL
  Filled 2014-04-14: qty 1

## 2014-04-14 NOTE — Progress Notes (Signed)
[name], male   DOB: 12/28/1935, 79 y.o.   MRN: [id number] [facility] PROGRESS NOTE  Jeffrey Nguyen [id number]:[id number] DOB: 06/06/1935 DOA: 04/08/2014 PCP: [name], MD  Brief narrative:    79 y.o. male with a history of HTN, Hyperlipidemia, Bipolar Disorder, OSA who presented to [facility] ED with increased confusion, suicidal ideations. He was further evaluated by psychiatry in ED but because he was lethargic and his chest x-ray showed possible pneumonia he was admitted for management of pneumonia. Per psych pt has no capacity to medical treatment decisions.   Assessment/Plan:     Principal Problem: Acute respiratory failure with hypoxia / community-acquired pneumonia / leukocytosis  Chest x-ray on the admission showed increased bibasilar airspace opacity especially at the left lung base compatible with atelectasis or pneumonia.   CT chest on the admission showed no acute findings but there was focal opacity in the right lower lobe at the posterior lung base measuring 11 mm, most likely atelectasis or scarring but neoplastic disease may be possible. We will likely repeat CT chest in the next couple of days to monitor for resolution of pneumonia.  Continue Levaquin daily.  Blood cultures to date are negative  Active Problems: Acute encephalopathy / acute delirium / bipolar disorder  Acute encephalopathy likely secondary to multiple issues, primarily psychiatric issues. CT head did not show acute intracranial findings.  No sitter at the bedside.  Psychiatry consulted - no capacity for medical treatment decisions.   Essential hypertension  Continue Norvasc 5 mg daily  DVT Prophylaxis   Lovenox subcutaneous ordered  Code Status: Full.  Family Communication:  Family not at the bedside Disposition Plan: IVC, remains inpatient  IV access:  Peripheral IV  Procedures and diagnostic studies:    Ct Abdomen Pelvis Wo Contrast 04/07/2014 1. No acute findings. 2.  Focal opacity in the right lower lobe at the posterior lung base measuring 11 mm. Although this is most likely atelectasis and/or scarring, neoplastic disease is possible. Recommend either followup PET-CT or short term CT follow-up with repeat unenhanced chest CT in 3 months. 3. No other evidence of neoplastic disease. 4. Bladder wall thickening which may be due to chronic bladder outlet obstruction, although the prostate gland is not appear overtly enlarged. 5. Scattered sigmoid colon diverticula. No diverticulitis or bowel inflammation. Cause of the patient's diarrhea is unclear.     Dg Chest 2 View 04/09/2014    1. Increased bibasilar airspace opacity, especially at the left lung base, compatible with atelectasis or pneumonia. 2. Thoracic spondylosis.     Ct Head Wo Contrast 04/08/2014   No acute intracranial abnormalities. Mild atrophy and small vessel ischemic changes similar prior study.     Ct Chest Wo Contrast 04/07/2014 1. No acute findings. 2. Focal opacity in the right lower lobe at the posterior lung base measuring 11 mm. Although this is most likely atelectasis and/or scarring, neoplastic disease is possible. Recommend either followup PET-CT or short term CT follow-up with repeat unenhanced chest CT in 3 months. 3. No other evidence of neoplastic disease. 4. Bladder wall thickening which may be due to chronic bladder outlet obstruction, although the prostate gland is not appear overtly enlarged. 5. Scattered sigmoid colon diverticula. No diverticulitis or bowel inflammation. Cause of the patient's diarrhea is unclear.      Medical Consultants:  None   Other Consultants:  None   IAnti-Infectives:   Levaquin 04/09/2014 -->    Manson Passey, MD  [facility] Pager [phone]  If 7PM-7AM, please contact night-coverage [email or web address] Password [email or web address] 04/14/2014, 11:30 AM   LOS: 6 days    HPI/Subjective: No acute overnight events.  Objective: Filed Vitals:   04/13/14 2005 04/14/14  0210 04/14/14 0517 04/14/14 0920  BP: 138/68 135/72 150/82   Pulse: 98 97 86   Temp: 98 F (36.7 C) 98.9 F (37.2 C) 98 F (36.7 C)   TempSrc: Oral Axillary Axillary   Resp: 18 20 20    Height:      Weight:      SpO2: 94% 93% 92% 92%    Intake/Output Summary (Last 24 hours) at 04/14/14 1130 Last data filed at 04/14/14 0521  Gross per 24 hour  Intake    720 ml  Output   1275 ml  Net   -555 ml    Exam:   General:  Pt is alert,  not in acute distress  Cardiovascular: Regular rate and rhythm, S1/S2, no murmurs  Respiratory: Clear to auscultation bilaterally, no wheezing, no crackles, no rhonchi    Data Reviewed: Basic Metabolic Panel:  Recent Labs Lab 04/08/14 0401 04/09/14 2054 04/10/14 0535  NA 137 140 142  K 4.2 4.1 4.0  CL 104 107 109  CO2 21 26 26   GLUCOSE 123* 99 101*  BUN 28* 18 16  CREATININE 1.88* 1.26 1.15  CALCIUM 9.0 8.8 8.7   Liver Function Tests:  Recent Labs Lab 04/08/14 0401  AST 24  ALT 20  ALKPHOS 84  BILITOT 0.7  PROT 6.8  ALBUMIN 4.1   No results for input(s): LIPASE, AMYLASE in the last 168 hours. No results for input(s): AMMONIA in the last 168 hours. CBC:  Recent Labs Lab 04/08/14 0401 04/09/14 2054 04/10/14 0535  WBC 10.6* 6.8 8.2  NEUTROABS  --  3.9  --   HGB 15.2 12.7* 13.0  HCT 45.5 39.6 40.3  MCV 87.2 88.4 89.4  PLT 243 221 209   Cardiac Enzymes: No results for input(s): CKTOTAL, CKMB, CKMBINDEX, TROPONINI in the last 168 hours. BNP: Invalid input(s): POCBNP CBG: No results for input(s): GLUCAP in the last 168 hours.  Recent Results (from the past 240 hour(s))  Blood culture (routine x 2)     Status: None (Preliminary result)   Collection Time: 04/09/14 11:00 PM  Result Value Ref Range Status   Specimen Description BLOOD LEFT FOREARM  Final   Special Requests BOTTLES DRAWN AEROBIC AND ANAEROBIC 3CC  Final   Culture   Final           BLOOD CULTURE RECEIVED NO GROWTH TO DATE CULTURE WILL BE HELD FOR 5  DAYS BEFORE ISSUING A FINAL NEGATIVE REPORT Performed at [facility]    Report Status PENDING  Incomplete  Blood culture (routine x 2)     Status: None (Preliminary result)   Collection Time: 04/09/14 11:00 PM  Result Value Ref Range Status   Specimen Description BLOOD LEFT ARM  Final   Special Requests BOTTLES DRAWN AEROBIC AND ANAEROBIC 5CC  Final   Culture   Final           BLOOD CULTURE RECEIVED NO GROWTH TO DATE CULTURE WILL BE HELD FOR 5 DAYS BEFORE ISSUING A FINAL NEGATIVE REPORT Performed at [facility]    Report Status PENDING  Incomplete     Scheduled Meds: . amLODipine  5 mg Oral Daily  . antiseptic oral rinse  7 mL Mouth Rinse q12n4p  . chlorhexidine  15 mL Mouth Rinse BID  . enoxaparin (  LOVENOX) injection  40 mg Subcutaneous QHS  . Influenza vac split quadrivalent PF  0.5 mL Intramuscular Tomorrow-1000  . ipratropium-albuterol  3 mL Nebulization TID  . levofloxacin  500 mg Oral QHS   Continuous Infusions:

## 2014-04-14 NOTE — Evaluation (Signed)
Physical Therapy Evaluation Patient Details Name: Jeffrey BoutonCurtis A Cohill MRN: [id number] DOB: Jun 24, 1935 Today's Date: 04/14/2014   History of Present Illness  79 yo male admitted with Pna, acute delirium. Hx of HTN, bipolar d/o, AAA, paranoid schizophrenia.   Clinical Impression  On eval, pt required Min-Mod assist for mobility-able to ambulate ~15 feet in room with walker. No family present during session to determine PLOF. Pt participate well with session but remains confused-A&Ox1.     Follow Up Recommendations SNF;Supervision/Assistance - 24 hour (vs inpatient psych hospital)    Equipment Recommendations  None recommended by PT    Recommendations for Other Services       Precautions / Restrictions Precautions Precautions: Fall Precaution Comments: confusion Restrictions Weight Bearing Restrictions: No      Mobility  Bed Mobility Overal bed mobility: Needs Assistance Bed Mobility: Supine to Sit     Supine to sit: Min assist     General bed mobility comments: Assist for trunk and LEs. Increased time.   Transfers Overall transfer level: Needs assistance Equipment used: Rolling walker (2 wheeled) Transfers: Sit to/from Stand Sit to Stand: Mod assist         General transfer comment: Assist to rise, stabilize, control descent. Multimodal cues for safety, technique, hand placement. Mod posterior lean with initial standing.   Ambulation/Gait Ambulation/Gait assistance: Min assist Ambulation Distance (Feet): 15 Feet Assistive device: Rolling walker (2 wheeled) Gait Pattern/deviations: Step-through pattern;Decreased stride length;Narrow base of support;Trunk flexed     General Gait Details: Assist to stabilize pt and maneuver safely with walker. Unsteady.   Stairs            Wheelchair Mobility    Modified Rankin (Stroke Patients Only)       Balance Overall balance assessment: Needs assistance Sitting-balance support: Feet supported;Bilateral upper  extremity supported Sitting balance-Leahy Scale: Good     Standing balance support: Bilateral upper extremity supported;During functional activity Standing balance-Leahy Scale: Poor                               Pertinent Vitals/Pain Pain Assessment: No/denies pain    Home Living Family/patient expects to be discharged to:: Unsure (SNF vs inpatient psych) Living Arrangements: Spouse/significant other             Home Equipment: Walker - 2 wheels Additional Comments: no family present to determine home environment or PLOF    Prior Function                 Hand Dominance        Extremity/Trunk Assessment   Upper Extremity Assessment: Generalized weakness           Lower Extremity Assessment: Generalized weakness      Cervical / Trunk Assessment: Kyphotic  Communication   Communication: No difficulties  Cognition Arousal/Alertness: Awake/alert Behavior During Therapy: Flat affect Overall Cognitive Status: No family/caregiver present to determine baseline cognitive functioning Area of Impairment: Memory;Orientation;Safety/judgement Orientation Level: Place;Time;Situation       Safety/Judgement: Decreased awareness of safety          General Comments      Exercises        Assessment/Plan    PT Assessment Patient needs continued PT services  PT Diagnosis Difficulty walking;Generalized weakness;Altered mental status   PT Problem List Decreased strength;Decreased activity tolerance;Decreased balance;Decreased mobility;Decreased knowledge of use of DME;Decreased cognition;Decreased safety awareness  PT Treatment Interventions DME instruction;Gait training;Functional mobility training;Therapeutic activities;Therapeutic  exercise;Patient/family education;Balance training   PT Goals (Current goals can be found in the Care Plan section) Acute Rehab PT Goals Patient Stated Goal: none stated PT Goal Formulation: Patient unable to  participate in goal setting Time For Goal Achievement: 04/28/14 Potential to Achieve Goals: Fair    Frequency Min 3X/week   Barriers to discharge        Co-evaluation               End of Session Equipment Utilized During Treatment: Gait belt Activity Tolerance: Patient tolerated treatment well Patient left: in chair;with call bell/phone within reach;with chair alarm set. Condom catheter leaking and pt's linens soaked completely through. Assisted pt to recliner with chair alarm in place. Made NT aware of need for linen change.            Time: 1434-1500 PT Time Calculation (min) (ACUTE ONLY): 26 min   Charges:   PT Evaluation $Initial PT Evaluation Tier I: 1 Procedure PT Treatments $Gait Training: 8-22 mins $Therapeutic Activity: 8-22 mins   PT G Codes:        Rebeca Alert, MPT Pager: [phone]

## 2014-04-14 NOTE — Discharge Summary (Addendum)
Physician Discharge Summary  Jeffrey Nguyen:[id number] DOB: 1936-01-10 DOA: 04/08/2014  PCP: [name], MD  Admit date: 04/08/2014 Discharge date: 04/15/2014  Recommendations for Outpatient Follow-up:    Focal opacity in the right lower lobe at the posterior lung base measuring 11 mm. Although this is most likely atelectasis and/or scarring, neoplastic disease is possible. Recommend either followup PET-CT or short term CT follow-up with repeat unenhanced chest CT in 3 months.   Discharge Diagnoses:  Principal Problem:   CAP (community acquired pneumonia) Active Problems:   Dementia   Acute delirium   Hypertension   Hyperlipidemia   Hypoxemia   Bipolar affective disorder   Sleep apnea    Discharge Condition: stable   Diet recommendation: as tolerated   History of present illness:  79 y.o. male with a history of HTN, Hyperlipidemia, Bipolar Disorder, OSA who presented to [facility] ED with increased confusion, suicidal ideations. He was further evaluated by psychiatry in ED but because he was lethargic and his chest x-ray showed possible pneumonia he was admitted for management of pneumonia. Per psych pt has no capacity to medical treatment decisions.   Assessment/Plan:     Principal Problem: Acute respiratory failure with hypoxia / community-acquired pneumonia / leukocytosis  Chest x-ray on the admission showed increased bibasilar airspace opacity especially at the left lung base compatible with atelectasis or pneumonia.   CT chest on the admission showed no acute findings but there was focal opacity in the right lower lobe at the posterior lung base measuring 11 mm, most likely atelectasis or scarring but neoplastic disease may be possible.   Patient has completed a total of 7 days treatment with Levaquin. Levaquin is not needed at the time of discharge.  Blood cultures to date are negative  Active Problems: Acute encephalopathy / acute delirium / bipolar  disorder  Acute encephalopathy likely secondary to multiple issues, primarily psychiatric issues. CT head did not show acute intracranial findings.  No sitter at the bedside.  Psychiatry consulted - no capacity for medical treatment decisions.  Essential hypertension  Continue Norvasc 10 mg daily  DVT Prophylaxis   Lovenox subcutaneous ordered while pt is in hospital   Code Status: Full.  Family Communication: Family not at the bedside   IV access:  Peripheral IV  Procedures and diagnostic studies:   Ct Abdomen Pelvis Wo Contrast 04/07/2014 1. No acute findings. 2. Focal opacity in the right lower lobe at the posterior lung base measuring 11 mm. Although this is most likely atelectasis and/or scarring, neoplastic disease is possible. Recommend either followup PET-CT or short term CT follow-up with repeat unenhanced chest CT in 3 months. 3. No other evidence of neoplastic disease. 4. Bladder wall thickening which may be due to chronic bladder outlet obstruction, although the prostate gland is not appear overtly enlarged. 5. Scattered sigmoid colon diverticula. No diverticulitis or bowel inflammation. Cause of the patient's diarrhea is unclear.   Dg Chest 2 View 04/09/2014 1. Increased bibasilar airspace opacity, especially at the left lung base, compatible with atelectasis or pneumonia. 2. Thoracic spondylosis.   Ct Head Wo Contrast 04/08/2014 No acute intracranial abnormalities. Mild atrophy and small vessel ischemic changes similar prior study.   Ct Chest Wo Contrast 04/07/2014 1. No acute findings. 2. Focal opacity in the right lower lobe at the posterior lung base measuring 11 mm. Although this is most likely atelectasis and/or scarring, neoplastic disease is possible. Recommend either followup PET-CT or short term CT follow-up with repeat unenhanced chest CT  in 3 months. 3. No other evidence of neoplastic disease. 4. Bladder wall thickening which may be due to chronic  bladder outlet obstruction, although the prostate gland is not appear overtly enlarged. 5. Scattered sigmoid colon diverticula. No diverticulitis or bowel inflammation. Cause of the patient's diarrhea is unclear.    Medical Consultants:  None   Other Consultants:  None   IAnti-Infectives:   Levaquin 04/09/2014 --> 02/14/2015    Signed:  Manson Passey, MD  [facility] 04/14/2014, 4:49 PM  Pager #: [phone]   Discharge Exam: Filed Vitals:   04/14/14 0517  BP: 150/82  Pulse: 86  Temp: 98 F (36.7 C)  Resp: 20   Filed Vitals:   04/14/14 0210 04/14/14 0517 04/14/14 0920 04/14/14 1508  BP: 135/72 150/82    Pulse: 97 86    Temp: 98.9 F (37.2 C) 98 F (36.7 C)    TempSrc: Axillary Axillary    Resp: 20 20    Height:      Weight:      SpO2: 93% 92% 92% 94%    General: Pt is alert, not in acute distress Cardiovascular: Regular rate and rhythm, S1/S2 (+) Respiratory: Clear to auscultation bilaterally, no wheezing, no crackles, no rhonchi Abdominal: Soft, non tender, non distended, bowel sounds +, no guarding Extremities: no edema, no cyanosis, pulses palpable bilaterally DP and PT Neuro: Grossly nonfocal  Discharge Instructions     Medication List    STOP taking these medications        benztropine 1 MG tablet  Commonly known as:  COGENTIN     thiothixene 5 MG capsule  Commonly known as:  NAVANE     venlafaxine XR 150 MG 24 hr capsule  Commonly known as:  EFFEXOR-XR      TAKE these medications        amLODipine 10 MG tablet  Commonly known as:  NORVASC  Take 1 tablet (10 mg total) by mouth daily.  Start taking on:  04/15/2014     aspirin 325 MG tablet  Take 325 mg by mouth daily.     atorvastatin 40 MG tablet  Commonly known as:  LIPITOR  Take 40 mg by mouth daily.     buPROPion 150 MG 24 hr tablet  Commonly known as:  WELLBUTRIN XL  Take 150 mg by mouth daily.     haloperidol decanoate 100 MG/ML injection  Commonly known  as:  HALDOL DECANOATE  Inject 50 mg into the muscle every 28 (twenty-eight) days.     LORazepam 1 MG tablet  Commonly known as:  ATIVAN  Take 1 tablet (1 mg total) by mouth 2 (two) times daily as needed for anxiety.           Follow-up Information    Follow up with [name], MD. Schedule an appointment as soon as possible for a visit in 4 weeks.   Specialty:  Family Medicine   Why:  As needed, Follow up appt after recent hospitalization   Contact information:   [address] [place] [address] [phone]        The results of significant diagnostics from this hospitalization (including imaging, microbiology, ancillary and laboratory) are listed below for reference.    Significant Diagnostic Studies: Ct Abdomen Pelvis Wo Contrast  04/07/2014   CLINICAL DATA:  Cough, 10# wt loss and diarrhea x 2 months. Nonsmoker. No prev chest or abd surg. No hx ca. No prev CT.  EXAM: CT CHEST, ABDOMEN AND  PELVIS WITHOUT CONTRAST  TECHNIQUE: Multidetector CT imaging of the chest, abdomen and pelvis was performed following the standard protocol without IV contrast.  COMPARISON:  None.  FINDINGS: CT CHEST FINDINGS  No neck base or axillary masses or adenopathy.  Heart is normal in size. Moderate coronary artery calcifications. Great vessels are normal in caliber. No mediastinal or hilar masses or adenopathy.  Focal irregular opacity in the right lower lobe at the posterior lung base measures 11 mm. This may reflect atelectasis or scarring. Neoplastic disease is not excluded but felt less likely. No other evidence of a lung mass. No discrete nodules.  There is coarse reticular and linear opacity in both lung bases, most consistent with atelectasis/ scarring. Remainder of the lungs is clear.  No pleural effusion.  No pneumothorax.  CT ABDOMEN AND PELVIS FINDINGS  Liver, spleen, gallbladder, pancreas, adrenal glands:  Unremarkable.  No renal masses or stones. No hydronephrosis.  Ureters are normal course and in caliber.  Bladder is only mildly distended. The wall is thickened. No bladder mass or stone is seen. Prostate gland is borderline enlarged. Fat planes between the prostate, bladder base and seminal vesicles are well preserved.  There is low-attenuation homogeneous material within the right distal inguinal canal. This may reflect a retracted testicle. It may reflect fluid within the small hernia.  No pathologically enlarged lymph nodes. There is no ascites. There are atherosclerotic calcifications along a normal caliber abdominal aorta and the iliac vessels.  Scattered sigmoid colon diverticula. No diverticulitis. Colon otherwise unremarkable. Normal small bowel. Normal appendix is visualized.  MUSCULOSKELETAL:  No osteoblastic or osteolytic lesions. Bones are demineralized. There are degenerative changes throughout the visualized spine.  IMPRESSION: 1. No acute findings. 2. Focal opacity in the right lower lobe at the posterior lung base measuring 11 mm. Although this is most likely atelectasis and/or scarring, neoplastic disease is possible. Recommend either followup PET-CT or short term CT follow-up with repeat unenhanced chest CT in 3 months. 3. No other evidence of neoplastic disease. 4. Bladder wall thickening which may be due to chronic bladder outlet obstruction, although the prostate gland is not appear overtly enlarged. 5. Scattered sigmoid colon diverticula. No diverticulitis or bowel inflammation. Cause of the patient's diarrhea is unclear.   Electronically Signed   By: Amie Portlandavid  Ormond M.D.   On: 04/07/2014 10:46   Dg Chest 2 View  04/09/2014   CLINICAL DATA:  Confusion.  Dementia.  Cough.  EXAM: CHEST  2 VIEW  COMPARISON:  04/07/2014 CT chest  FINDINGS: Low lung volumes. Elevated right hemidiaphragm. Increasing airspace opacity at the lung bases, especially in the retrocardiac position on the left, causing poor definition of the left hemidiaphragm which was not present  previously. The airspace opacity at the right lung bases bandlike probably from atelectasis.  Thoracic spondylosis. Scattered residual contrast medium within diverticula in the abdomen.  IMPRESSION: 1. Increased bibasilar airspace opacity, especially at the left lung base, compatible with atelectasis or pneumonia. 2. Thoracic spondylosis.   Electronically Signed   By: Herbie BaltimoreWalt  Liebkemann M.D.   On: 04/09/2014 21:18   Ct Head Wo Contrast  04/08/2014   CLINICAL DATA:  Altered mental status. History of hypertension, bipolar, schizophrenia, and depression. History of fall slightly. Old laceration to the back of the head from a few days ago.  EXAM: CT HEAD WITHOUT CONTRAST  TECHNIQUE: Contiguous axial images were obtained from the base of the skull through the vertex without intravenous contrast.  COMPARISON:  02/27/2014  FINDINGS: Mild  diffuse cerebral atrophy. Low-attenuation changes in the deep white matter consistent with small vessel ischemia. No mass effect or midline shift. No abnormal extra-axial fluid collections. Gray-white matter junctions are distinct. Basal cisterns are not effaced. No evidence of acute intracranial hemorrhage. No depressed skull fractures. Visualized paranasal sinuses and mastoid air cells are not opacified.  IMPRESSION: No acute intracranial abnormalities. Mild atrophy and small vessel ischemic changes similar prior study.   Electronically Signed   By: Burman Nieves M.D.   On: 04/08/2014 05:54   Ct Chest Wo Contrast  04/07/2014   CLINICAL DATA:  Cough, 10# wt loss and diarrhea x 2 months. Nonsmoker. No prev chest or abd surg. No hx ca. No prev CT.  EXAM: CT CHEST, ABDOMEN AND PELVIS WITHOUT CONTRAST  TECHNIQUE: Multidetector CT imaging of the chest, abdomen and pelvis was performed following the standard protocol without IV contrast.  COMPARISON:  None.  FINDINGS: CT CHEST FINDINGS  No neck base or axillary masses or adenopathy.  Heart is normal in size. Moderate coronary artery  calcifications. Great vessels are normal in caliber. No mediastinal or hilar masses or adenopathy.  Focal irregular opacity in the right lower lobe at the posterior lung base measures 11 mm. This may reflect atelectasis or scarring. Neoplastic disease is not excluded but felt less likely. No other evidence of a lung mass. No discrete nodules.  There is coarse reticular and linear opacity in both lung bases, most consistent with atelectasis/ scarring. Remainder of the lungs is clear.  No pleural effusion.  No pneumothorax.  CT ABDOMEN AND PELVIS FINDINGS  Liver, spleen, gallbladder, pancreas, adrenal glands:  Unremarkable.  No renal masses or stones. No hydronephrosis. Ureters are normal course and in caliber.  Bladder is only mildly distended. The wall is thickened. No bladder mass or stone is seen. Prostate gland is borderline enlarged. Fat planes between the prostate, bladder base and seminal vesicles are well preserved.  There is low-attenuation homogeneous material within the right distal inguinal canal. This may reflect a retracted testicle. It may reflect fluid within the small hernia.  No pathologically enlarged lymph nodes. There is no ascites. There are atherosclerotic calcifications along a normal caliber abdominal aorta and the iliac vessels.  Scattered sigmoid colon diverticula. No diverticulitis. Colon otherwise unremarkable. Normal small bowel. Normal appendix is visualized.  MUSCULOSKELETAL:  No osteoblastic or osteolytic lesions. Bones are demineralized. There are degenerative changes throughout the visualized spine.  IMPRESSION: 1. No acute findings. 2. Focal opacity in the right lower lobe at the posterior lung base measuring 11 mm. Although this is most likely atelectasis and/or scarring, neoplastic disease is possible. Recommend either followup PET-CT or short term CT follow-up with repeat unenhanced chest CT in 3 months. 3. No other evidence of neoplastic disease. 4. Bladder wall thickening  which may be due to chronic bladder outlet obstruction, although the prostate gland is not appear overtly enlarged. 5. Scattered sigmoid colon diverticula. No diverticulitis or bowel inflammation. Cause of the patient's diarrhea is unclear.   Electronically Signed   By: Amie Portland M.D.   On: 04/07/2014 10:46    Microbiology: Recent Results (from the past 240 hour(s))  Blood culture (routine x 2)     Status: None (Preliminary result)   Collection Time: 04/09/14 11:00 PM  Result Value Ref Range Status   Specimen Description BLOOD LEFT FOREARM  Final   Special Requests BOTTLES DRAWN AEROBIC AND ANAEROBIC 3CC  Final   Culture   Final  BLOOD CULTURE RECEIVED NO GROWTH TO DATE CULTURE WILL BE HELD FOR 5 DAYS BEFORE ISSUING A FINAL NEGATIVE REPORT Performed at [facility]    Report Status PENDING  Incomplete  Blood culture (routine x 2)     Status: None (Preliminary result)   Collection Time: 04/09/14 11:00 PM  Result Value Ref Range Status   Specimen Description BLOOD LEFT ARM  Final   Special Requests BOTTLES DRAWN AEROBIC AND ANAEROBIC 5CC  Final   Culture   Final           BLOOD CULTURE RECEIVED NO GROWTH TO DATE CULTURE WILL BE HELD FOR 5 DAYS BEFORE ISSUING A FINAL NEGATIVE REPORT Performed at [facility]    Report Status PENDING  Incomplete     Labs: Basic Metabolic Panel:  Recent Labs Lab 04/08/14 0401 04/09/14 2054 04/10/14 0535  NA 137 140 142  K 4.2 4.1 4.0  CL 104 107 109  CO2 GLUCOSE 123* 99 101*  BUN 28* 18 16  CREATININE 1.88* 1.26 1.15  CALCIUM 9.0 8.8 8.7   Liver Function Tests:  Recent Labs Lab 04/08/14 0401  AST 24  ALT 20  ALKPHOS 84  BILITOT 0.7  PROT 6.8  ALBUMIN 4.1   No results for input(s): LIPASE, AMYLASE in the last 168 hours. No results for input(s): AMMONIA in the last 168 hours. CBC:  Recent Labs Lab 04/08/14 0401 04/09/14 2054 04/10/14 0535  WBC 10.6* 6.8 8.2  NEUTROABS  --  3.9  --    HGB 15.2 12.7* 13.0  HCT 45.5 39.6 40.3  MCV 87.2 88.4 89.4  PLT 243 221 209   Cardiac Enzymes: No results for input(s): CKTOTAL, CKMB, CKMBINDEX, TROPONINI in the last 168 hours. BNP: BNP (last 3 results) No results for input(s): PROBNP in the last 8760 hours. CBG: No results for input(s): GLUCAP in the last 168 hours.  Time coordinating discharge: Over 30 minutes

## 2014-04-14 NOTE — Discharge Instructions (Signed)

## 2014-04-14 NOTE — Progress Notes (Signed)
Clinical Social Work  Per attending MD, patient is medically stable to DC to inpatient psych hospital. CSW contacted the following facilities re: bed availability:  [facility]- [facility] beds  [facility] on [facility]  [facility]- [facility] beds. Referral faxed.  [facility]- available beds. Referral faxed.  [facility]- no available beds.  [facility]- unable to accept due to dementia diagnosis  [facility]- [facility] beds. Referral faxed.  [facility]- [facility] fax[facility].  [facility]- no available beds.  CSW will continue to follow.  [place]olly Dwayne [place], [place] [phone]

## 2014-04-15 MED ORDER — LEVOFLOXACIN 500 MG PO TABS
500.0000 mg | ORAL_TABLET | Freq: Once | ORAL | Status: AC
  Administered 2014-04-15: 500 mg via ORAL
  Filled 2014-04-15: qty 1

## 2014-04-15 NOTE — Progress Notes (Addendum)
Clinical Social Work  [facility] by Dr. Alvester MorinBell to room 9450-2. RN to call report to [phone]. CSW updated girlfriend Jeffrey Sine(Nancy) via phone of DC plans. Patient requires Sheriff to transport due to IVC status. CSW spoke with sheriff who reports they will call CSW back with estimated time they can arrive to the hospital and then CSW can arrange PTAR at that time. CSW updated RN and MD of DC plans.  CSW will continue to follow.  Jeffrey Nguyen, [place] [phone]  Addendum [phone] Sheriff called and reports they can have sheriff at hospital around 3:30pm to transport [facility]. CSW arranged 3:30pm pick up with PTAR as well. PTAR forms on chart with IVC and RN aware of DC plans.  CSW is signing off but available if further needs arise.

## 2014-04-15 NOTE — Progress Notes (Signed)
[facility]

## 2014-04-15 NOTE — Progress Notes (Signed)
Clinical Social Work  CSW continues to work on placement for patient. CSW contacted the following facilities:  [facility]- no beds  [facility] and long waiting list  [facility]- spoke with [facility] at [facility] who provided [facility] #: [id number]. Referral faxed.  [facility]- admissions reports they received referral on 1/11 but no available beds today.  [facility]- admissions reports they misplaced referral from 1/11 and asked CSW to re-fax information. Referral faxed.  [facility]- no available beds.  [facility]- unable to accept due to dementia diagnosis  [facility]- admissions reports they received referral on 1/11 but has not reviewed and unsure if any beds will be available today.  [facility]- [facility].  [facility]- unsure of bed status but encouraged CSW to fax referral in case unexpected DC occurs. Referral faxed.  CSW will continue to follow.  [employer] E[place]Holly Jax Nguyen, [place] [phone]

## 2014-04-15 NOTE — Progress Notes (Signed)
Report called to Cleora FleetPam Sands, RN at [facility]. WIll await PTAR and Sheriff's dept.

## 2014-04-16 LAB — CULTURE, BLOOD (ROUTINE X 2)
Culture: NO GROWTH
Culture: NO GROWTH

## 2014-04-24 ENCOUNTER — Emergency Department (HOSPITAL_COMMUNITY)
Admission: EM | Admit: 2014-04-24 | Discharge: 2014-04-24 | Disposition: A | Discharge status: Home / Self Care | Payer: Medicare Other | Attending: Emergency Medicine | Admitting: Emergency Medicine

## 2014-04-24 ENCOUNTER — Emergency Department (HOSPITAL_COMMUNITY): Payer: Medicare Other

## 2014-04-24 ENCOUNTER — Encounter (HOSPITAL_COMMUNITY): Payer: Self-pay | Admitting: Emergency Medicine

## 2014-04-24 DIAGNOSIS — Z79899 Other long term (current) drug therapy: Secondary | ICD-10-CM | POA: Diagnosis not present

## 2014-04-24 DIAGNOSIS — Z8701 Personal history of pneumonia (recurrent): Secondary | ICD-10-CM | POA: Insufficient documentation

## 2014-04-24 DIAGNOSIS — F29 Unspecified psychosis not due to a substance or known physiological condition: Secondary | ICD-10-CM | POA: Insufficient documentation

## 2014-04-24 DIAGNOSIS — Z87438 Personal history of other diseases of male genital organs: Secondary | ICD-10-CM | POA: Insufficient documentation

## 2014-04-24 DIAGNOSIS — E785 Hyperlipidemia, unspecified: Secondary | ICD-10-CM | POA: Diagnosis not present

## 2014-04-24 DIAGNOSIS — F1310 Sedative, hypnotic or anxiolytic abuse, uncomplicated: Secondary | ICD-10-CM | POA: Diagnosis not present

## 2014-04-24 DIAGNOSIS — R4182 Altered mental status, unspecified: Secondary | ICD-10-CM | POA: Diagnosis present

## 2014-04-24 DIAGNOSIS — Z8669 Personal history of other diseases of the nervous system and sense organs: Secondary | ICD-10-CM | POA: Insufficient documentation

## 2014-04-24 DIAGNOSIS — Z87891 Personal history of nicotine dependence: Secondary | ICD-10-CM | POA: Insufficient documentation

## 2014-04-24 DIAGNOSIS — F23 Brief psychotic disorder: Secondary | ICD-10-CM

## 2014-04-24 DIAGNOSIS — F319 Bipolar disorder, unspecified: Secondary | ICD-10-CM | POA: Insufficient documentation

## 2014-04-24 DIAGNOSIS — Z7982 Long term (current) use of aspirin: Secondary | ICD-10-CM | POA: Insufficient documentation

## 2014-04-24 DIAGNOSIS — I10 Essential (primary) hypertension: Secondary | ICD-10-CM | POA: Insufficient documentation

## 2014-04-24 LAB — URINALYSIS, ROUTINE W REFLEX MICROSCOPIC
Bilirubin Urine: NEGATIVE
GLUCOSE, UA: NEGATIVE mg/dL
Hgb urine dipstick: NEGATIVE
KETONES UR: NEGATIVE mg/dL
Leukocytes, UA: NEGATIVE
NITRITE: NEGATIVE
PH: 5.5 (ref 5.0–8.0)
PROTEIN: NEGATIVE mg/dL
Specific Gravity, Urine: 1.019 (ref 1.005–1.030)
UROBILINOGEN UA: 0.2 mg/dL (ref 0.0–1.0)

## 2014-04-24 LAB — I-STAT CHEM 8, ED
BUN: 23 mg/dL (ref 6–23)
CALCIUM ION: 1.03 mmol/L — AB (ref 1.13–1.30)
CHLORIDE: 104 meq/L (ref 96–112)
Creatinine, Ser: 1.5 mg/dL — ABNORMAL HIGH (ref 0.50–1.35)
Glucose, Bld: 110 mg/dL — ABNORMAL HIGH (ref 70–99)
HCT: 38 % — ABNORMAL LOW (ref 39.0–52.0)
Hemoglobin: 12.9 g/dL — ABNORMAL LOW (ref 13.0–17.0)
Potassium: 4.6 mmol/L (ref 3.5–5.1)
Sodium: 139 mmol/L (ref 135–145)
TCO2: 23 mmol/L (ref 0–100)

## 2014-04-24 LAB — CBC WITH DIFFERENTIAL/PLATELET
BASOS ABS: 0 10*3/uL (ref 0.0–0.1)
BASOS PCT: 0 % (ref 0–1)
EOS ABS: 0.4 10*3/uL (ref 0.0–0.7)
EOS PCT: 5 % (ref 0–5)
HEMATOCRIT: 38.3 % — AB (ref 39.0–52.0)
HEMOGLOBIN: 12.6 g/dL — AB (ref 13.0–17.0)
Lymphocytes Relative: 20 % (ref 12–46)
Lymphs Abs: 1.5 10*3/uL (ref 0.7–4.0)
MCH: 29.3 pg (ref 26.0–34.0)
MCHC: 32.9 g/dL (ref 30.0–36.0)
MCV: 89.1 fL (ref 78.0–100.0)
MONO ABS: 0.8 10*3/uL (ref 0.1–1.0)
Monocytes Relative: 10 % (ref 3–12)
Neutro Abs: 5 10*3/uL (ref 1.7–7.7)
Neutrophils Relative %: 65 % (ref 43–77)
Platelets: 239 10*3/uL (ref 150–400)
RBC: 4.3 MIL/uL (ref 4.22–5.81)
RDW: 14.9 % (ref 11.5–15.5)
WBC: 7.7 10*3/uL (ref 4.0–10.5)

## 2014-04-24 LAB — CBG MONITORING, ED: Glucose-Capillary: 101 mg/dL — ABNORMAL HIGH (ref 70–99)

## 2014-04-24 LAB — I-STAT TROPONIN, ED: Troponin i, poc: 0 ng/mL (ref 0.00–0.08)

## 2014-04-24 LAB — RAPID URINE DRUG SCREEN, HOSP PERFORMED
Amphetamines: NOT DETECTED
Barbiturates: NOT DETECTED
Benzodiazepines: POSITIVE — AB
COCAINE: NOT DETECTED
OPIATES: NOT DETECTED
Tetrahydrocannabinol: NOT DETECTED

## 2014-04-24 LAB — I-STAT CG4 LACTIC ACID, ED: Lactic Acid, Venous: 1.83 mmol/L (ref 0.5–2.0)

## 2014-04-24 MED ORDER — SODIUM CHLORIDE 0.9 % IV BOLUS (SEPSIS)
1000.0000 mL | Freq: Once | INTRAVENOUS | Status: AC
  Administered 2014-04-24: 1000 mL via INTRAVENOUS

## 2014-04-24 NOTE — ED Provider Notes (Signed)
CSN: [id number]     Arrival date & time 04/24/14  0556 History   First MD Initiated Contact with Patient 04/24/14 [id number]     Chief Complaint  Patient presents with  . Altered Mental Status     (Consider location/radiation/quality/duration/timing/severity/associated sxs/prior Treatment) HPI   79 year old male with history of schizoaffective disorder, depression, hypertension, who was recently admitted and treated for pneumonia and released several days ago presented to the ER for evaluation of altered mental status. According to nursing report, patient was brought here by his girlfriend. Per girlfriend, patient was admitted in the hospital for several weeks with treatment for pneumonia and was discharged yesterday. It was noted that he was behaving normally, lucid, and interactive yesterday however around yesterday afternoon girlfriend noticed that patient was confused, talking nonsense and not his normal self. She reports that patient had recently had his psychiatric medication change from Navane to Zyprexa. She anticipates that the medication change may have caused the symptoms and therefore she gave him both the old medication and the new medication but noticed no improvement. Patient was brought here for further evaluation. Level V caveats applied due to altered mental status.  Past Medical History  Diagnosis Date  . Bipolar affective disorder   . Depression   . Hypertension   . Hyperlipidemia   . Sleep apnea   . BPH (benign prostatic hyperplasia)   . Hypoxemia   . Pneumonia   . AAA (abdominal aortic aneurysm) 10/13   History reviewed. No pertinent past surgical history. Family History  Problem Relation Age of Onset  . Multiple sclerosis Sister    History  Substance Use Topics  . Smoking status: Former Games developer  . Smokeless tobacco: Not on file  . Alcohol Use: Yes    Review of Systems  Unable to perform ROS: Mental status change      Allergies  Review of patient's  allergies indicates no known allergies.  Home Medications   Prior to Admission medications   Medication Sig Start Date End Date Taking? Authorizing Provider  amLODipine (NORVASC) 10 MG tablet Take 1 tablet (10 mg total) by mouth daily. 04/15/14   Alison Murray, MD  aspirin 325 MG tablet Take 325 mg by mouth daily.    Historical Provider, MD  atorvastatin (LIPITOR) 40 MG tablet Take 40 mg by mouth daily.    Historical Provider, MD  buPROPion (WELLBUTRIN XL) 150 MG 24 hr tablet Take 150 mg by mouth daily.    Historical Provider, MD  haloperidol decanoate (HALDOL DECANOATE) 100 MG/ML injection Inject 50 mg into the muscle every 28 (twenty-eight) days.      Historical Provider, MD  LORazepam (ATIVAN) 1 MG tablet Take 1 tablet (1 mg total) by mouth 2 (two) times daily as needed for anxiety. 04/14/14   Alison Murray, MD   BP 107/55 mmHg  Pulse 87  Temp(Src) 98 F (36.7 C) (Oral)  Resp 18  Wt 195 lb (88.451 kg)  SpO2 94% Physical Exam  Constitutional: He appears well-developed and well-nourished. No distress.  HENT:  Head: Atraumatic.  Mouth is dry  Eyes: Conjunctivae and EOM are normal. Pupils are equal, round, and reactive to light.  Neck: Normal range of motion. Neck supple.  No nuchal rigidity  Cardiovascular: Normal rate and regular rhythm.   Pulmonary/Chest: Effort normal and breath sounds normal.  Abdominal: Soft. Bowel sounds are normal. He exhibits no distension. There is no tenderness.  Neurological: He is alert. GCS eye subscore is 4. GCS verbal  subscore is 5. GCS motor subscore is 6.  Neurologic exam:  Speech is garble, pupils equal round reactive to light, extraocular movements intact  Normal peripheral visual fields Cranial nerves III through XII normal including no facial droop Follows commands, moves all extremities x4, normal strength to bilateral upper and lower extremities at all major muscle groups including grip Sensation normal to light touch  Coordination intact,  no limb ataxia, finger-nose-finger normal    Skin: No rash noted.  Psychiatric: He has a normal mood and affect. His speech is tangential. He is slowed.  Nursing note and vitals reviewed.   ED Course  Procedures (including critical care time)  6:26 AM Pt presents for evaluation of AMS.  He has psychiatric hx and may have received several different medications yesterday which may contributes to his state of mind.  He is able to follow simple command.  No gross focal neuro deficit noted on initial exam.  When asked if he has any headache/cp/sob/abd pain pt denies.  He is currently in NAD, VSS.  He does appears dehydrated.  Work up initiated.  Care discussed with Dr. Read DriversMolpus.   8:48 AM Evidence of renal insufficiency with a creatinine of 1.5 elevated from his prior value. Labs otherwise reassuring. Chest x-ray shows improved basilar aeration. Residual opacity at the base favor atelectasis. Head CT scan shows no acute intracranial finding. When I enter patient's room, he has stripped himself naked, walking around the room, and there's a puddle of urine on the ground.  He remained altered.    9:13 AM I did contact patient's girlfriend Harriett Sineancy, who currently is living with him. She states that patient has psychiatric illness with episodes of altered mental status similar to the symptoms that he has currently. She does not think that he should be home given his confusion level and would prefer for him to be managed psychiatrically. His psychiatrist is Dr. Betti Cruzeddy. Plan to consult with Dr. Betti Cruzeddy for additional management.  9:30 AM I have consulted with Dr. Betti Cruzeddy who knows the patient well.  He also saw the patient in his office yesterday and gave pt his monthly haldol shot.  Dr. Betti Cruzeddy recommend for pt to be discharge and pt can f/u with him either on Tuesday or weds in his office for reassurance.  Dr. Betti Cruzeddy felt that his psychosis will improve in time once the medication is allow sufficient time to work.  Pt  tolerates PO, does not have other medical problem requiring admission.    9:55 AM I called and notify pt's girlfriend of Dr. Reddy's recommendation.  She acknowledge of plan.  Return precaution discussed.    Labs Review Labs Reviewed  CBC WITH DIFFERENTIAL - Abnormal; Notable for the following:    Hemoglobin 12.6 (*)    HCT 38.3 (*)    All other components within normal limits  URINE RAPID DRUG SCREEN (HOSP PERFORMED) - Abnormal; Notable for the following:    Benzodiazepines POSITIVE (*)    All other components within normal limits  CBG MONITORING, ED - Abnormal; Notable for the following:    Glucose-Capillary 101 (*)    All other components within normal limits  I-STAT CHEM 8, ED - Abnormal; Notable for the following:    Creatinine, Ser 1.50 (*)    Glucose, Bld 110 (*)    Calcium, Ion 1.03 (*)    Hemoglobin 12.9 (*)    HCT 38.0 (*)    All other components within normal limits  URINALYSIS, ROUTINE W REFLEX MICROSCOPIC  I-STAT CG4 LACTIC ACID, ED  Rosezena Sensor, ED    Imaging Review Dg Chest 2 View  04/24/2014   CLINICAL DATA:  Altered mental status  EXAM: CHEST  2 VIEW  COMPARISON:  04/09/2013  FINDINGS: Chronic elevation of the right diaphragm. No cardiomegaly when accounting for technique. Mild upper mediastinal widening is stable from previous, ectatic vessels based on chest CT 04/07/2014.  Improved aeration of the lower lungs, with streaky lower lung opacities remaining. No edema, effusion, or pneumothorax.  IMPRESSION: Improved basilar aeration. Residual opacity at the bases favors atelectasis.   Electronically Signed   By: Tiburcio Pea M.D.   On: 04/24/2014 06:54   Ct Head Wo Contrast  04/24/2014   CLINICAL DATA:  Altered mental status.  EXAM: CT HEAD WITHOUT CONTRAST  TECHNIQUE: Contiguous axial images were obtained from the base of the skull through the vertex without intravenous contrast.  COMPARISON:  04/08/2014  FINDINGS: Skull and Sinuses:Negative for fracture or  destructive process. The mastoids, middle ears, and imaged paranasal sinuses are clear.  Orbits: No acute abnormality.  Brain: No evidence of acute infarction, hemorrhage, hydrocephalus, or mass lesion/mass effect. There is generalized cerebral volume loss and mild ischemic gliosis around the lateral ventricles, findings stable from the recent comparisons.  IMPRESSION: 1. No acute intracranial findings. 2. Mild brain atrophy and white matter disease.   Electronically Signed   By: Tiburcio Pea M.D.   On: 04/24/2014 06:58     EKG Interpretation None      Date: 04/24/2014  Rate: 85  Rhythm: normal sinus rhythm  QRS Axis: right  Intervals: QT prolonged  ST/T Wave abnormalities: nonspecific ST changes  Conduction Disutrbances:abnormal R-wave progression, late transition  Narrative Interpretation:   Old EKG Reviewed: unchanged    MDM   Final diagnoses:  Altered mental status  Brief psychotic disorder    BP 107/55 mmHg  Pulse 87  Temp(Src) 98 F (36.7 C) (Oral)  Resp 24  Wt 195 lb (88.451 kg)  SpO2 94%  I have reviewed nursing notes and vital signs. I personally reviewed the imaging tests through PACS system  I reviewed available ER/hospitalization records thought the EMR     Fayrene Helper, [place] 04/24/14 1610

## 2014-04-24 NOTE — ED Notes (Signed)
Bed: [id number] Expected date:  Expected time:  Means of arrival:  Comments: EMS 79 yo male/confusion

## 2014-04-24 NOTE — ED Notes (Signed)
PT to CT at this time.

## 2014-04-24 NOTE — Discharge Instructions (Signed)
Please call Dr. Reddy's office next week to schedule a close follow up appointment for further care.    Altered Mental Status Altered mental status most often refers to an abnormal change in your responsiveness and awareness. It can affect your speech, thought, mobility, memory, attention span, or alertness. It can range from slight confusion to complete unresponsiveness (coma). Altered mental status can be a sign of a serious underlying medical condition. Rapid evaluation and medical treatment is necessary for patients having an altered mental status. CAUSES   Low blood sugar (hypoglycemia) or diabetes.  Severe loss of body fluids (dehydration) or a body salt (electrolyte) imbalance.  A stroke or other neurologic problem, such as dementia or delirium.  A head injury or tumor.  A drug or alcohol overdose.  Exposure to toxins or poisons.  Depression, anxiety, and stress.  A low oxygen level (hypoxia).  An infection.  Blood loss.  Twitching or shaking (seizure).  Heart problems, such as heart attack or heart rhythm problems (arrhythmias).  A body temperature that is too low or too high (hypothermia or hyperthermia). DIAGNOSIS  A diagnosis is based on your history, symptoms, physical and neurologic examinations, and diagnostic tests. Diagnostic tests may include:  Measurement of your blood pressure, pulse, breathing, and oxygen levels (vital signs).  Blood tests.  Urine tests.  X-ray exams.  A computerized magnetic scan (magnetic resonance imaging, MRI).  A computerized X-ray scan (computed tomography, CT scan). TREATMENT  Treatment will depend on the cause. Treatment may include:  Management of an underlying medical or mental health condition.  Critical care or support in the hospital. HOME CARE INSTRUCTIONS   Only take over-the-counter or prescription medicines for pain, discomfort, or fever as directed by your caregiver.  Manage underlying conditions as directed  by your caregiver.  Eat a healthy, well-balanced diet to maintain strength.  Join a support group or prevention program to cope with the condition or trauma that caused the altered mental status. Ask your caregiver to help choose a program that works for you.  Follow up with your caregiver for further examination, therapy, or testing as directed. SEEK MEDICAL CARE IF:   You feel unwell or have chills.  You or your family notice a change in your behavior or your alertness.  You have trouble following your caregiver's treatment plan.  You have questions or concerns. SEEK IMMEDIATE MEDICAL CARE IF:   You have a rapid heartbeat or have chest pain.  You have difficulty breathing.  You have a fever.  You have a headache with a stiff neck.  You cough up blood.  You have blood in your urine or stool.  You have severe agitation or confusion. MAKE SURE YOU:   Understand these instructions.  Will watch your condition.  Will get help right away if you are not doing well or get worse. Document Released: 09/08/2009 Document Revised: 06/13/2011 Document Reviewed: 09/08/2009 [facility] Patient Information 2015 [employer], [place]. This information is not intended to replace advice given to you by your health care provider. Make sure you discuss any questions you have with your health care provider.

## 2014-04-24 NOTE — ED Notes (Signed)
Pt was found in room naked, had pulled off monitor leads, disconnected IVF's which were puddled onto the floor.  He was steady on his feet, however confused.  Oriented to self only.  Pt was redressed and placed back in bed and on monitor.  Reorientation provided.  Lights out, tv on, call bell in reach and door open to observe.

## 2014-04-24 NOTE — ED Notes (Signed)
Pt's girlfriend has been called to pick up pt.  States she will be here w/i an hour.

## 2014-04-24 NOTE — ED Notes (Addendum)
Spoke to pt's significant other, Harriett SineNancy. She states [facility] and followed with Dr. Betti Cruzeddy yesterday, where his medications were changed. She states pt was clear minded, then she went to take a shower and she found him going through mail, "trying to find keys and talking about the [employer]." Pt is disoriented to place, situation and time, which she states he was oriented to prior. She states that pt was unable to go to sleep, so she called 911. Pt was given Zyprexa and Thiothixene last night d/t pt's unusual behavior.

## 2014-04-24 NOTE — ED Notes (Signed)
BIB [facility], states that wife called for pt "talking to much." VS on scene. Pt denies complaints. Pt oriented to self only and to president. Hx of schizoaffective and bipolar.

## 2014-04-24 NOTE — ED Provider Notes (Signed)
Medical screening examination/treatment/procedure(s) were conducted as a shared visit with non-physician practitioner(s) and myself.  I personally evaluated the patient during the encounter.  6:36 AM Patient awake but confused. Vital signs within normal limits.     Hanley SeamenJohn L Chace Klippel, MD 04/24/14 [phone]

## 2014-04-24 NOTE — ED Notes (Signed)
Pt found walking in room after having pulled off gown and monitor leads.  Pt reoriented and assisted in getting dressed.  Reminded that his girlfriend is coming to pick him up

## 2014-05-05 DEATH — deceased

## 2016-07-23 IMAGING — CT CT HEAD W/O CM
2 series · 16 of 30 positions shown, 20 images · non-contrast
Comparison: 02/27/2014

CLINICAL DATA: Altered mental status. History of hypertension,
bipolar, schizophrenia, and depression. History of fall slightly.
Old laceration to the back of the head from a few days ago.

EXAM:
CT HEAD WITHOUT CONTRAST
TECHNIQUE: Contiguous axial images were obtained from the base of the skull
through the vertex without intravenous contrast.

[Series 2: head w/o · axial · non-contrast · 0.45mm/px · z∈[-136,-6]mm · 13 of 32 slices shown, 17 images]
[im 3/32  brain]
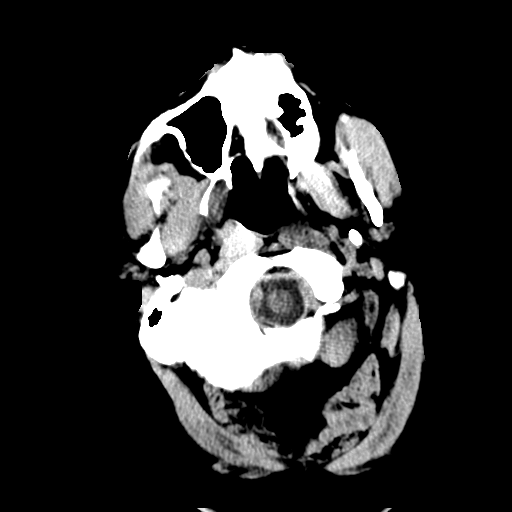
[im 3/32  bone]
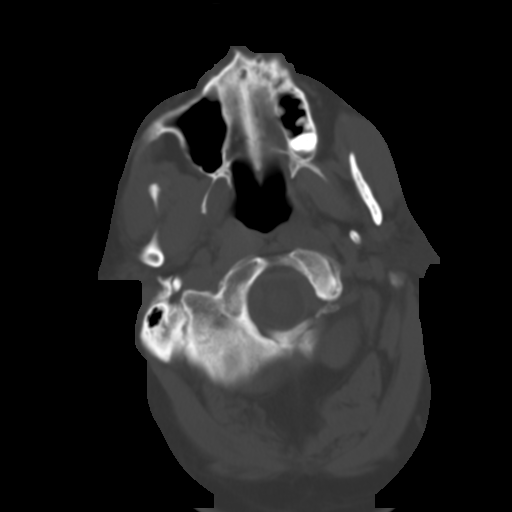
[im 5/32  brain]
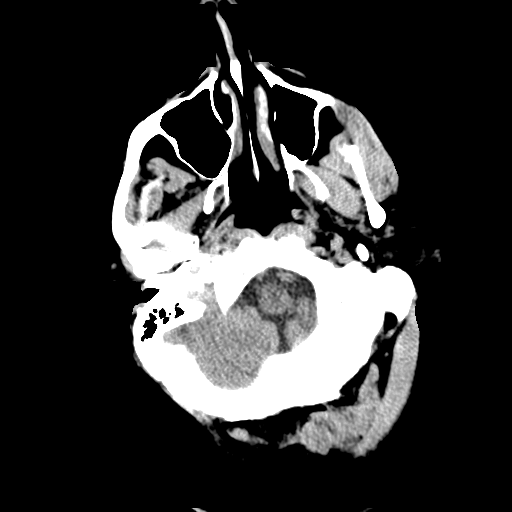
[im 7/32  brain]
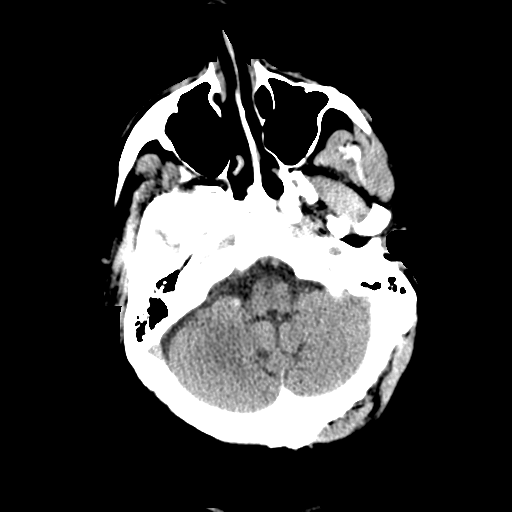
[im 9/32  brain]
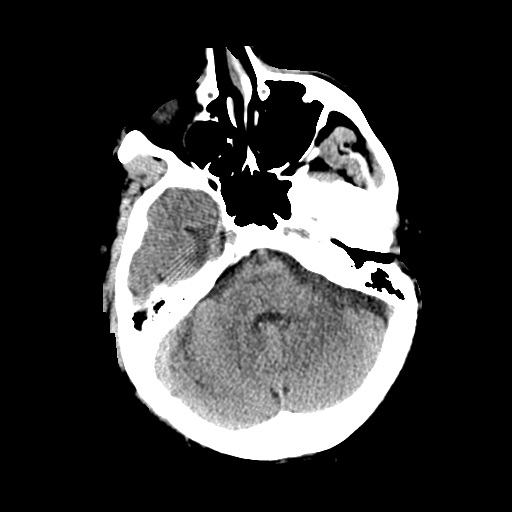
[im 12/32  brain]
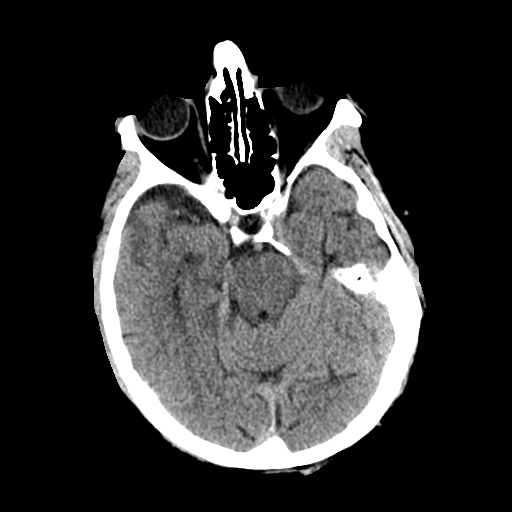
[im 12/32  bone]
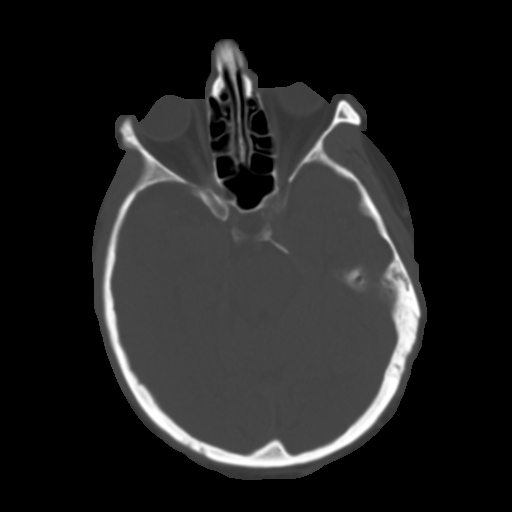
[im 14/32  brain]
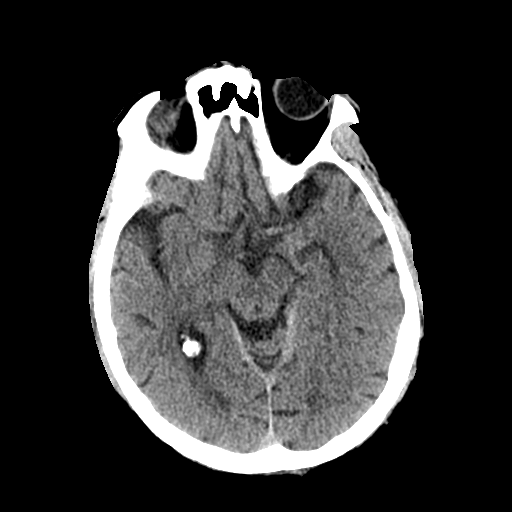
[im 16/32  brain]
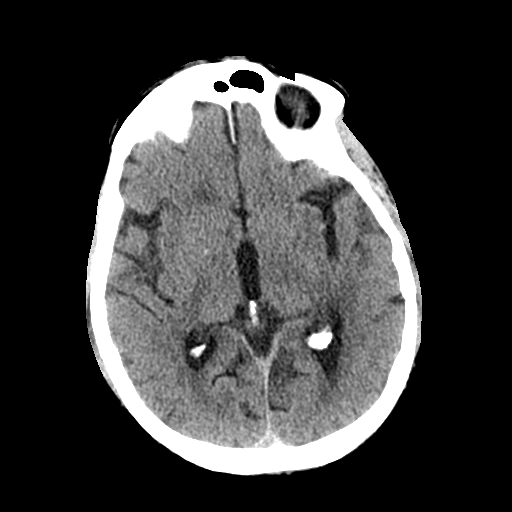
[im 18/32  brain]
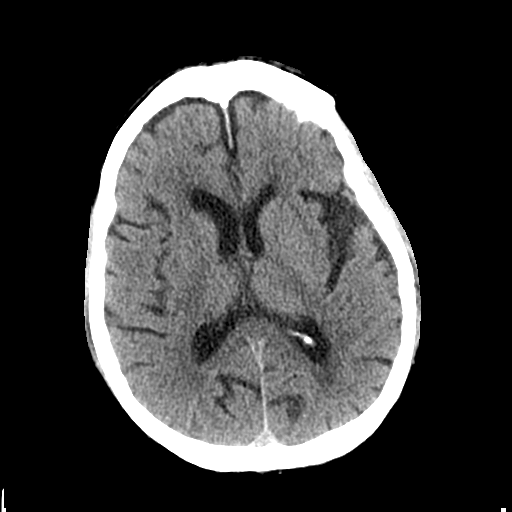
[im 20/32  brain]
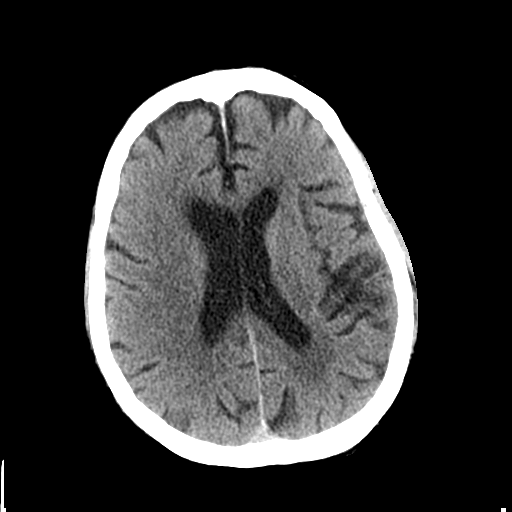
[im 20/32  bone]
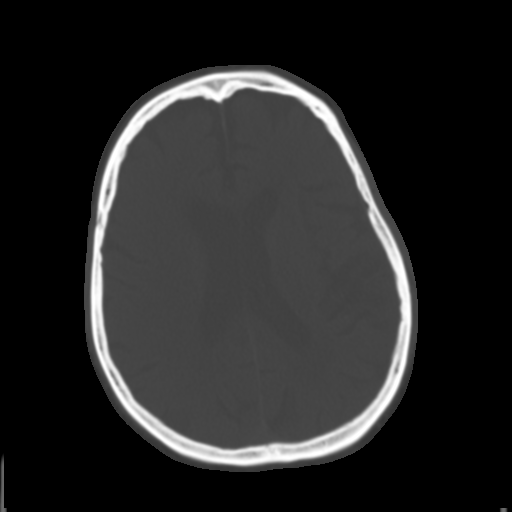
[im 23/32  brain]
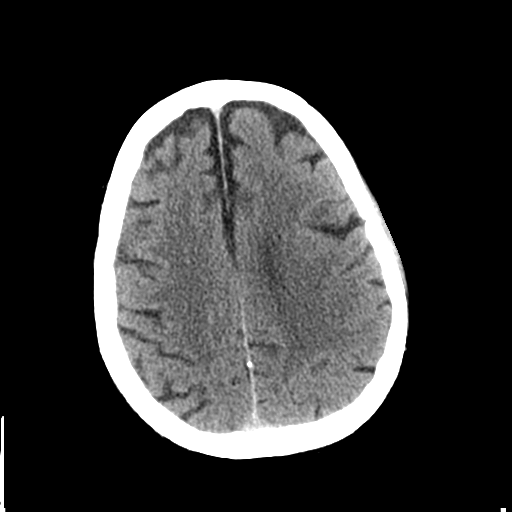
[im 25/32  brain]
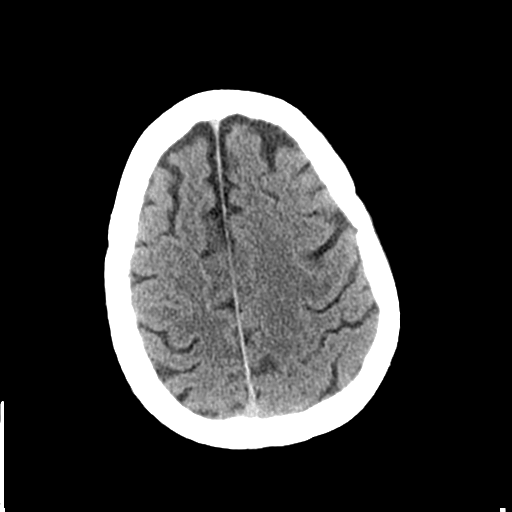
[im 27/32  brain]
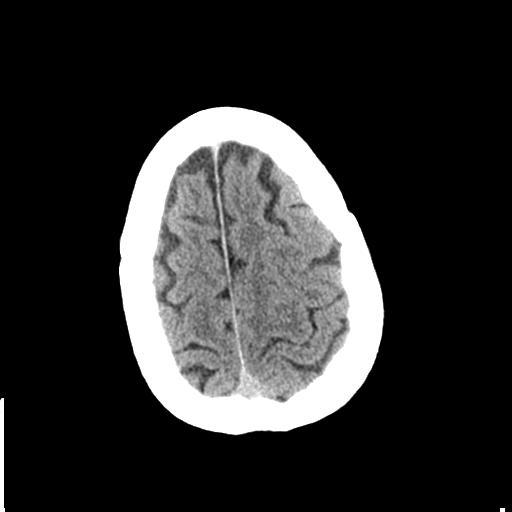
[im 29/32  brain]
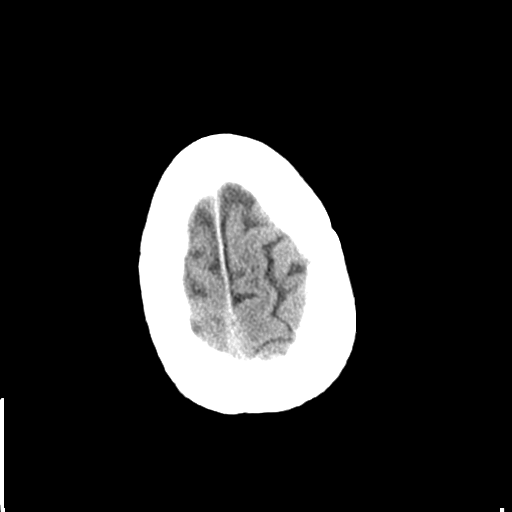
[im 29/32  bone]
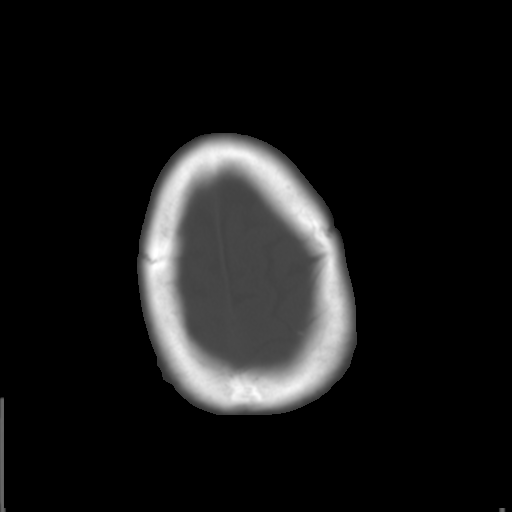

[Series 3: bone windows · axial · 0.45mm/px · z∈[-136,-91]mm · 3 of 32 slices shown]
[im 3/32  bone]
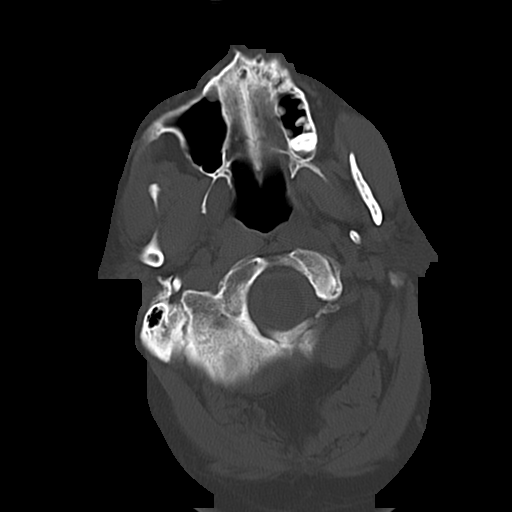
[im 7/32  bone]
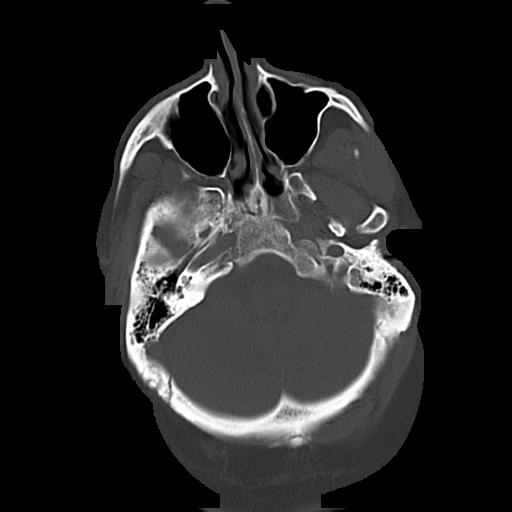
[im 12/32  bone]
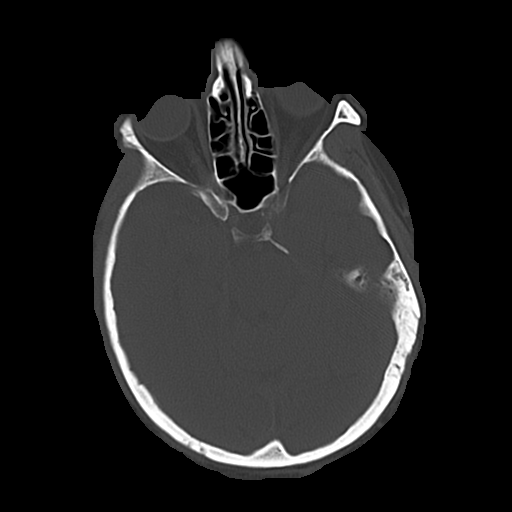

[16 of 30 positions shown; findings below may reference images not displayed]

FINDINGS: Mild diffuse cerebral atrophy. Low-attenuation changes in the deep
white matter consistent with small vessel ischemia. No mass effect
or midline shift. No abnormal extra-axial fluid collections.
Gray-white matter junctions are distinct. Basal cisterns are not
effaced. No evidence of acute intracranial hemorrhage. No depressed
skull fractures. Visualized paranasal sinuses and mastoid air cells
are not opacified.
IMPRESSION: No acute intracranial abnormalities. Mild atrophy and small vessel
ischemic changes similar prior study.

## 2016-08-08 IMAGING — CT CT HEAD W/O CM
2 series · 17 of 30 positions shown, 20 images · non-contrast
Comparison: 04/08/2014

CLINICAL DATA: Altered mental status.

EXAM:
CT HEAD WITHOUT CONTRAST
TECHNIQUE: Contiguous axial images were obtained from the base of the skull
through the vertex without intravenous contrast.

[Series 2: head w/o · axial · non-contrast · 0.45mm/px · z∈[-109,+11]mm · 9 of 32 slices shown, 12 images]
[im 4/32  brain]
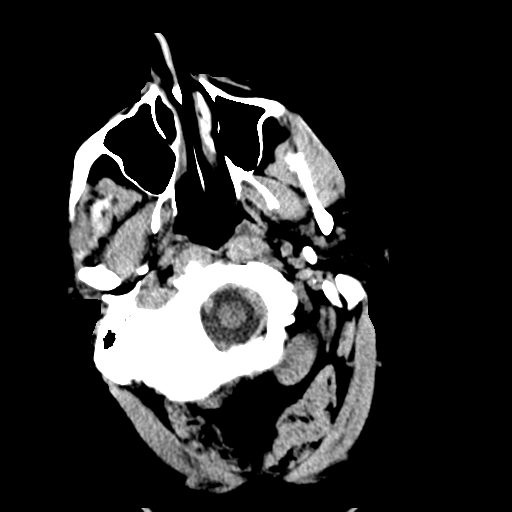
[im 4/32  bone]
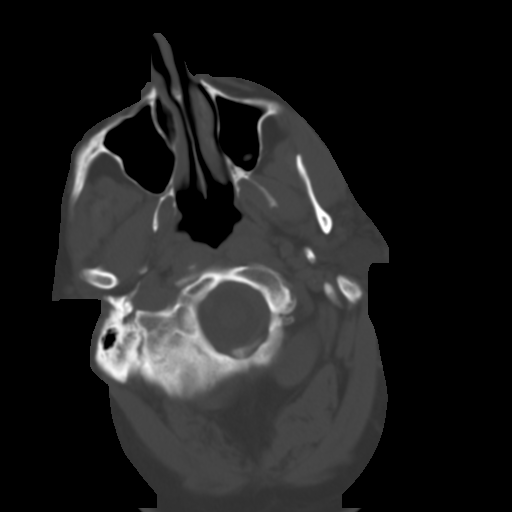
[im 7/32  brain]
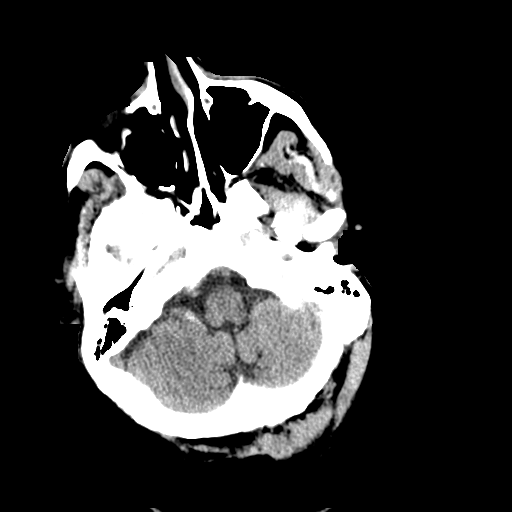
[im 10/32  brain]
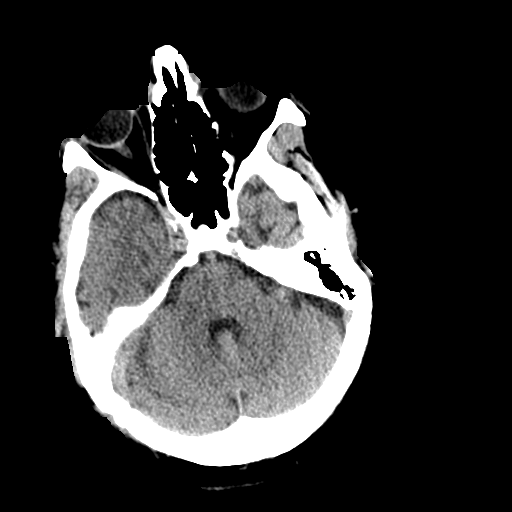
[im 13/32  brain]
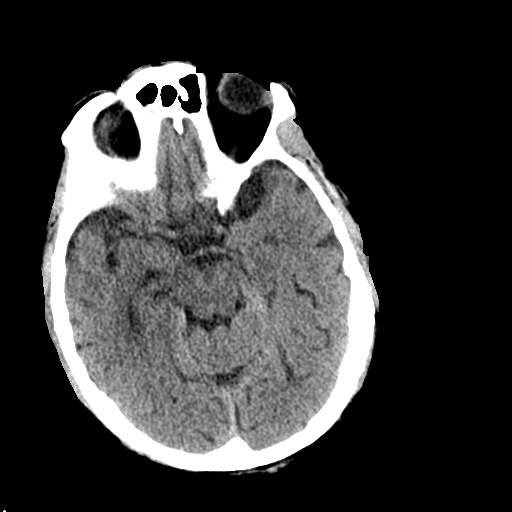
[im 16/32  brain]
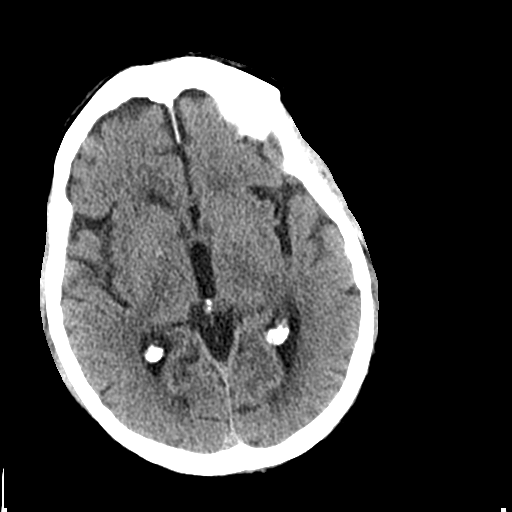
[im 16/32  bone]
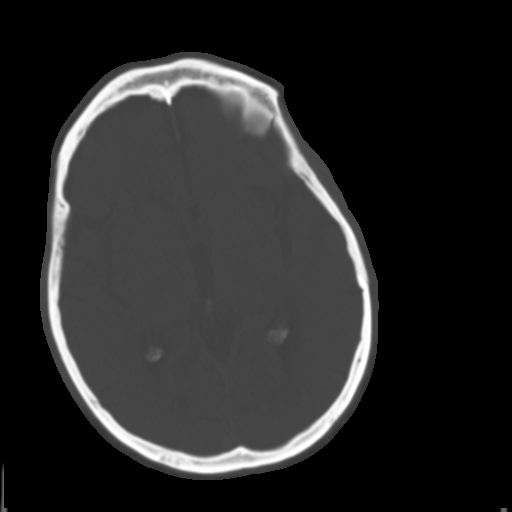
[im 19/32  brain]
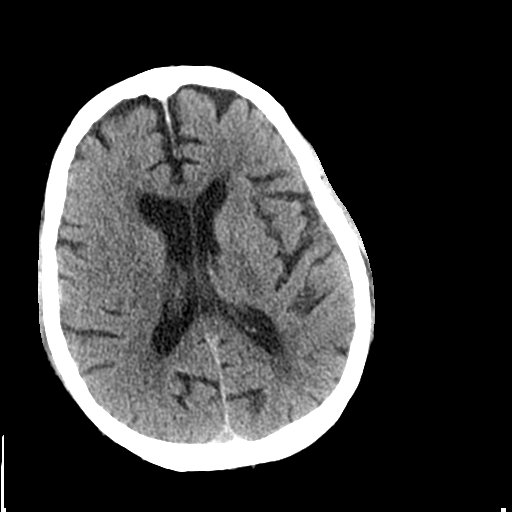
[im 22/32  brain]
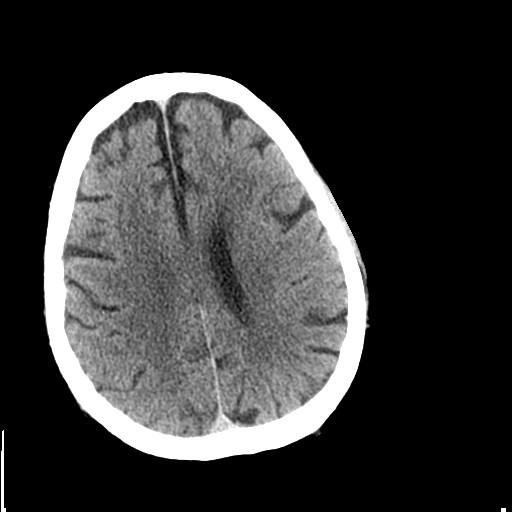
[im 25/32  brain]
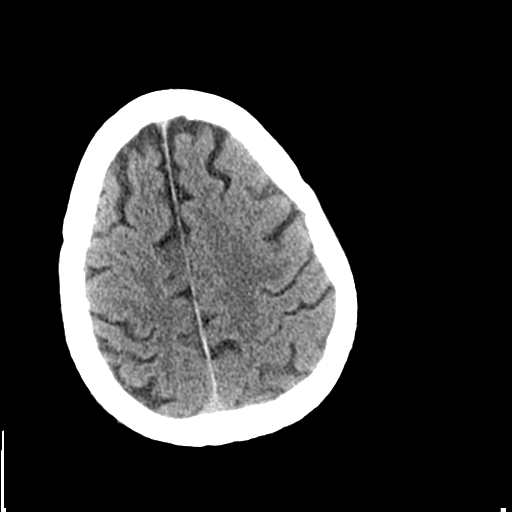
[im 28/32  brain]
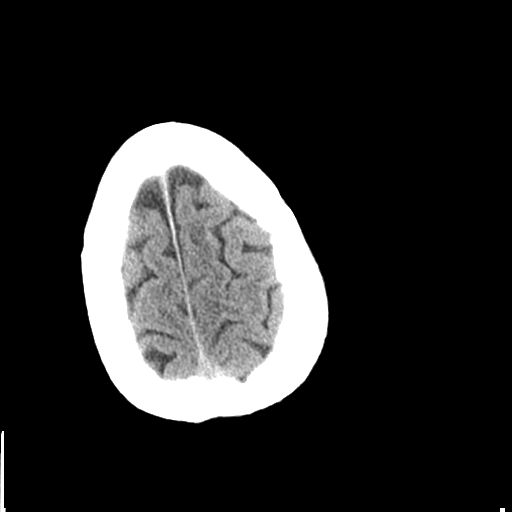
[im 28/32  bone]
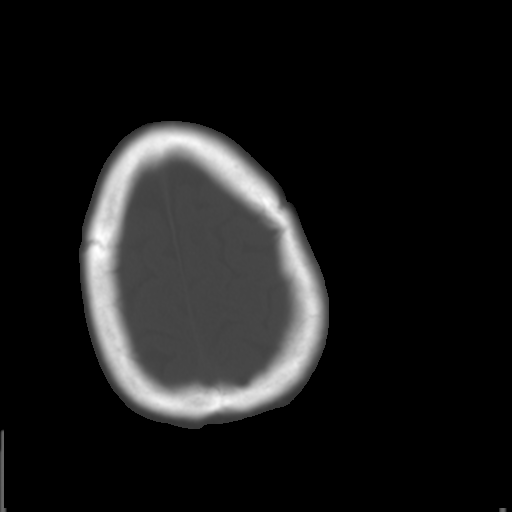

[Series 3: bone windows · axial · 0.45mm/px · z∈[-109,+14]mm · 8 of 53 slices shown]
[im 6/53  bone]
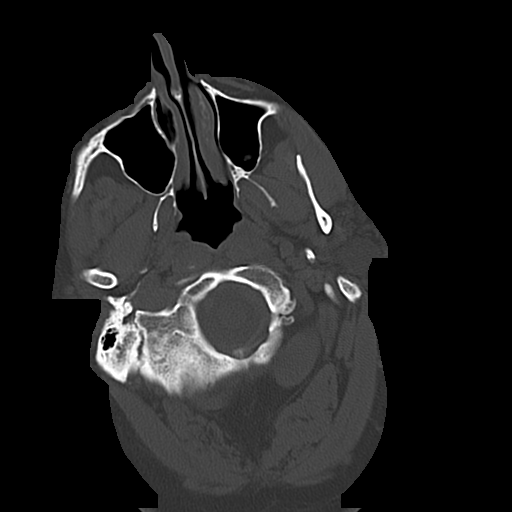
[im 12/53  bone]
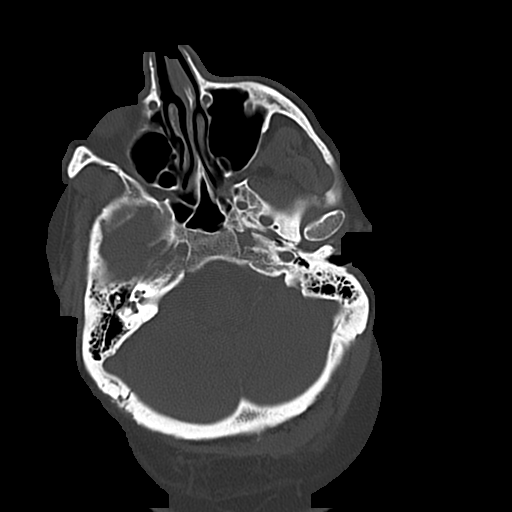
[im 18/53  bone]
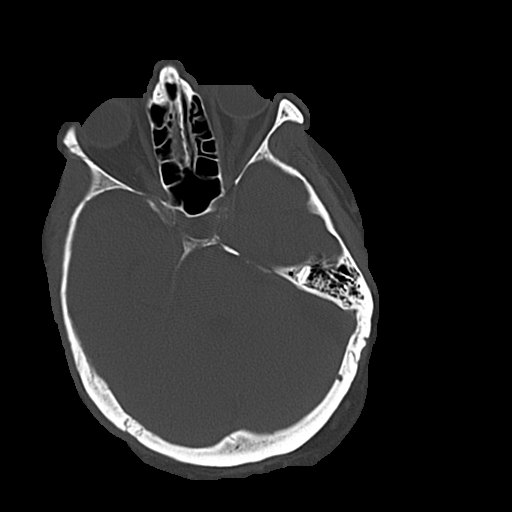
[im 24/53  bone]
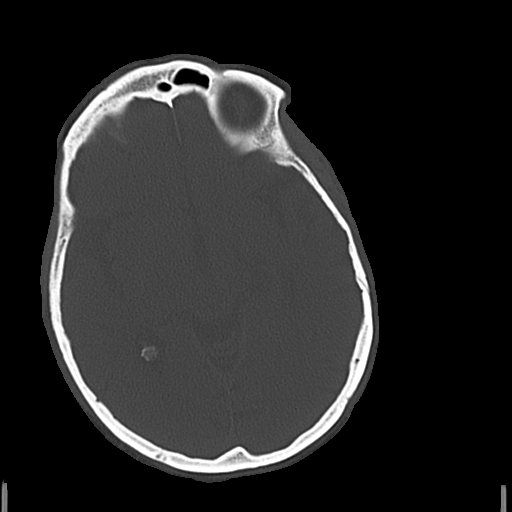
[im 29/53  bone]
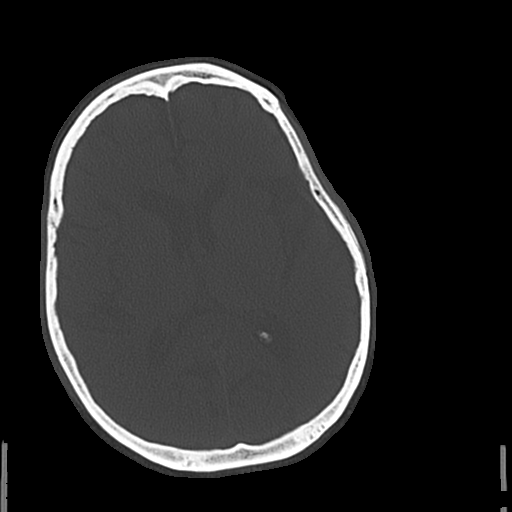
[im 35/53  bone]
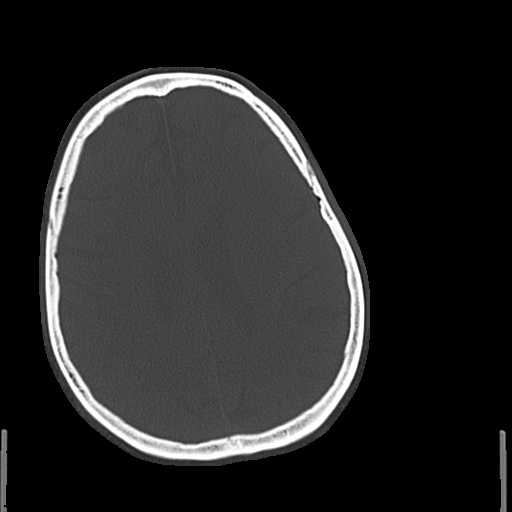
[im 41/53  bone]
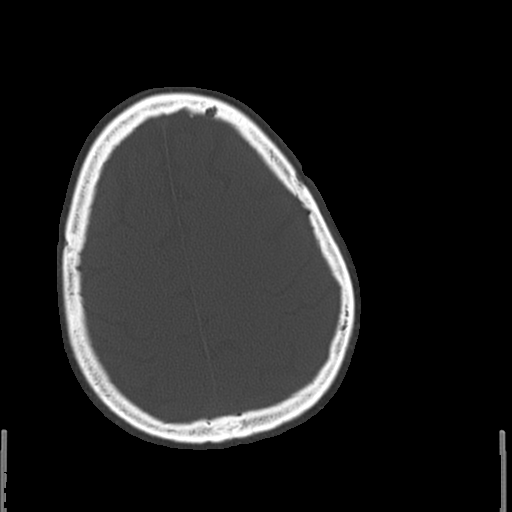
[im 47/53  bone]
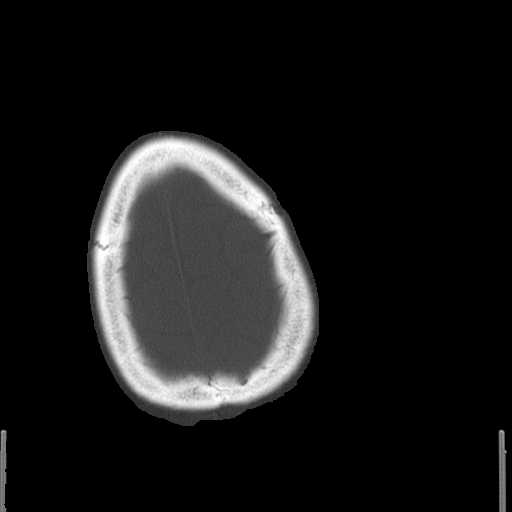

[17 of 30 positions shown; findings below may reference images not displayed]

FINDINGS: Skull and Sinuses:Negative for fracture or destructive process. The
mastoids, middle ears, and imaged paranasal sinuses are clear.

Orbits: No acute abnormality.

Brain: No evidence of acute infarction, hemorrhage, hydrocephalus,
or mass lesion/mass effect. There is generalized cerebral volume
loss and mild ischemic gliosis around the lateral ventricles,
findings stable from the recent comparisons.
IMPRESSION: 1. No acute intracranial findings.
2. Mild brain atrophy and white matter disease.
